# Patient Record
Sex: Female | Born: 1965
Health system: Southern US, Community
[De-identification: ages and names within clinical notes are randomized; demographics above are authoritative.]

## PROBLEM LIST (undated history)

## (undated) DIAGNOSIS — F909 Attention-deficit hyperactivity disorder, unspecified type: Secondary | ICD-10-CM

## (undated) DIAGNOSIS — K52831 Collagenous colitis: Secondary | ICD-10-CM

## (undated) DIAGNOSIS — F3281 Premenstrual dysphoric disorder: Secondary | ICD-10-CM

## (undated) DIAGNOSIS — K649 Unspecified hemorrhoids: Secondary | ICD-10-CM

## (undated) DIAGNOSIS — F419 Anxiety disorder, unspecified: Secondary | ICD-10-CM

## (undated) DIAGNOSIS — W109XXA Fall (on) (from) unspecified stairs and steps, initial encounter: Secondary | ICD-10-CM

## (undated) DIAGNOSIS — N946 Dysmenorrhea, unspecified: Secondary | ICD-10-CM

## (undated) HISTORY — DX: Anxiety disorder, unspecified: F41.9

## (undated) HISTORY — DX: Attention-deficit hyperactivity disorder, unspecified type: F90.9

## (undated) HISTORY — DX: Unspecified hemorrhoids: K64.9

## (undated) HISTORY — DX: Premenstrual dysphoric disorder: F32.81

## (undated) HISTORY — DX: Dysmenorrhea, unspecified: N94.6

## (undated) HISTORY — DX: Collagenous colitis: K52.831

## (undated) HISTORY — DX: Fall (on) (from) unspecified stairs and steps, initial encounter: W10.9XXA

---

## 2002-04-23 ENCOUNTER — Other Ambulatory Visit: Admission: RE | Admit: 2002-04-23 | Discharge: 2002-04-23 | Payer: Self-pay | Admitting: Obstetrics and Gynecology

## 2003-05-26 ENCOUNTER — Other Ambulatory Visit: Admission: RE | Admit: 2003-05-26 | Discharge: 2003-05-26 | Payer: Self-pay | Admitting: Obstetrics and Gynecology

## 2004-04-30 ENCOUNTER — Emergency Department (HOSPITAL_COMMUNITY): Admission: EM | Admit: 2004-04-30 | Discharge: 2004-04-30 | Payer: Self-pay | Admitting: Family Medicine

## 2004-05-21 ENCOUNTER — Other Ambulatory Visit: Admission: RE | Admit: 2004-05-21 | Discharge: 2004-05-21 | Payer: Self-pay | Admitting: Obstetrics and Gynecology

## 2005-03-20 ENCOUNTER — Inpatient Hospital Stay (HOSPITAL_COMMUNITY): Admission: AD | Admit: 2005-03-20 | Discharge: 2005-03-22 | Payer: Self-pay | Admitting: *Deleted

## 2005-03-25 ENCOUNTER — Ambulatory Visit: Admission: RE | Admit: 2005-03-25 | Discharge: 2005-03-25 | Payer: Self-pay | Admitting: Obstetrics and Gynecology

## 2005-04-28 ENCOUNTER — Other Ambulatory Visit: Admission: RE | Admit: 2005-04-28 | Discharge: 2005-04-28 | Payer: Self-pay | Admitting: Obstetrics and Gynecology

## 2005-11-08 ENCOUNTER — Ambulatory Visit: Payer: Self-pay | Admitting: Family Medicine

## 2005-11-15 ENCOUNTER — Ambulatory Visit: Payer: Self-pay | Admitting: Family Medicine

## 2005-11-29 ENCOUNTER — Ambulatory Visit: Payer: Self-pay | Admitting: Family Medicine

## 2005-12-26 ENCOUNTER — Ambulatory Visit: Payer: Self-pay | Admitting: Family Medicine

## 2007-08-01 ENCOUNTER — Ambulatory Visit: Payer: Self-pay | Admitting: Internal Medicine

## 2007-08-01 LAB — CONVERTED CEMR LAB
AST: 23 units/L (ref 0–37)
Albumin: 4.3 g/dL (ref 3.5–5.2)
Basophils Absolute: 0 10*3/uL (ref 0.0–0.1)
Bilirubin, Direct: 0.1 mg/dL (ref 0.0–0.3)
Chloride: 105 meq/L (ref 96–112)
Eosinophils Absolute: 0.1 10*3/uL (ref 0.0–0.6)
Eosinophils Relative: 2.1 % (ref 0.0–5.0)
GFR calc Af Amer: 118 mL/min
GFR calc non Af Amer: 98 mL/min
Glucose, Bld: 94 mg/dL (ref 70–99)
HCT: 36.1 % (ref 36.0–46.0)
Lymphocytes Relative: 33.3 % (ref 12.0–46.0)
MCHC: 35.1 g/dL (ref 30.0–36.0)
MCV: 96.6 fL (ref 78.0–100.0)
Neutro Abs: 2.6 10*3/uL (ref 1.4–7.7)
Neutrophils Relative %: 55.9 % (ref 43.0–77.0)
Potassium: 4.3 meq/L (ref 3.5–5.1)
RBC: 3.73 M/uL — ABNORMAL LOW (ref 3.87–5.11)
Sed Rate: 16 mm/hr (ref 0–25)
Sodium: 140 meq/L (ref 135–145)
TSH: 1.6 microintl units/mL (ref 0.35–5.50)

## 2007-08-02 ENCOUNTER — Encounter: Payer: Self-pay | Admitting: Internal Medicine

## 2007-09-14 ENCOUNTER — Ambulatory Visit: Payer: Self-pay | Admitting: Internal Medicine

## 2007-10-11 DIAGNOSIS — K52831 Collagenous colitis: Secondary | ICD-10-CM

## 2007-10-11 HISTORY — DX: Collagenous colitis: K52.831

## 2007-10-23 ENCOUNTER — Encounter: Payer: Self-pay | Admitting: Internal Medicine

## 2007-10-23 ENCOUNTER — Encounter: Payer: Self-pay | Admitting: Family Medicine

## 2007-10-23 ENCOUNTER — Ambulatory Visit: Payer: Self-pay | Admitting: Internal Medicine

## 2007-10-23 HISTORY — PX: COLONOSCOPY: SHX174

## 2008-01-02 ENCOUNTER — Ambulatory Visit: Payer: Self-pay | Admitting: Internal Medicine

## 2008-01-02 DIAGNOSIS — K52831 Collagenous colitis: Secondary | ICD-10-CM | POA: Insufficient documentation

## 2008-01-02 LAB — CONVERTED CEMR LAB
IgA: 243 mg/dL (ref 68–378)
Tissue Transglutaminase Ab, IgA: 0.3 units (ref <7)

## 2008-02-18 ENCOUNTER — Telehealth: Payer: Self-pay | Admitting: Internal Medicine

## 2008-06-13 ENCOUNTER — Telehealth: Payer: Self-pay | Admitting: Internal Medicine

## 2008-07-02 ENCOUNTER — Ambulatory Visit: Payer: Self-pay | Admitting: Internal Medicine

## 2011-02-22 NOTE — Assessment & Plan Note (Signed)
[facility]                         GASTROENTEROLOGY OFFICE NOTE   NAME:RILEYHiya, Point                       MRN:          [id number]  DATE:01/02/2008                            DOB:          1966-01-30    CHIEF COMPLAINT:  Followup of collagenous colitis.   Collagenous colitis was diagnosed at her colonoscopy.  That was on  January 13.  Small hemorrhoids, as well.  It was recommended that she  start Entocort EC.  She was concerned about possible steroid side  effects, given that her mother had lupus and was on prednisone, and  really waited until a week or so ago to start it.  Once she started it,  her diarrhea is under control.  She feels much better, less abdominal  pain and cramping, as well.  She also recently started Strattera for  ADHD.  She is not requiring Imodium anymore.  She is using Zyrtec  p.r.n., Zoloft 50 mg daily.  She takes Excedrin two a day for aches and  pains and thinks she is probably addicted to the caffeine in that.  She  does not have headaches or any problems like that.   PAST MEDICAL HISTORY:  Reviewed and unchanged.   Weight 145 pounds, pulse 66, blood pressure 104/72.   ASSESSMENT:  Collagenous colitis.  Autoimmune pathogen etiology, versus  medication reaction, explained to the patient today.  She is much better  on the Entocort.  The plan is to take the Entocort EC for six weeks.  She can stop it at that time, or if there are any withdrawal type  symptoms, she can taper it by 1 mg daily for two weeks.  The taper is  optional.  After that time, she will call me back when and if she  develops recurrence.  I explained that the course of this disease is  variable and that she could require repeat Entocort therapy and perhaps  aminosalicylate therapy, such as Asacol or other treatments.  Because of  a possible relationship to celiac disease, a sprue panel and IgA level  will be checked today.  If that were to be positive  in a significant  fashion (some of these antibodies are not specific), an upper GI  endoscopy with small-bowel biopsy could be indicated.  I explained that,  as well.  Collagenous colitis hand-out from the [employer]  [employer] given to the patient.   I will wait to hear from her regarding recurrent signs or symptoms  otherwise.  She will try to taper the Excedrin and talk to her primary  care doctor about that.     Iva Boop, MD,FACG  Electronically Signed    CEG/MedQ  DD: 01/02/2008  DT: 01/02/2008  Job #: [id number]   cc:   Molly Maduro A. Nicholos Johns, M.D.  Tera Mater. Clent Ridges, MD  Randye Lobo, M.D.

## 2011-02-22 NOTE — Assessment & Plan Note (Signed)
[facility]                         GASTROENTEROLOGY OFFICE NOTE   NAME:Latoya Olson, Latoya Olson                          MRN:          [id number]  DATE:08/01/2007                            DOB:          [birth year]    CHIEF COMPLAINT:  Diarrhea, abdominal pain.   HISTORY:  A 45 year old white woman who has had eight weeks of diarrhea.  It started acutely without any prodrome.  She has crampy periumbilical  pain before and during defecation.  The stools are urgent and can awaken  her at night.  After multiple stools, she has seen a drop or streak of  fresh bright red blood on the tissue paper but no other bleeding.  There  has not been  a lot of mucus.  There has been no weight loss, fever,  chills, or other constitutional symptoms.  I detect no history of prior  gastrointestinal illness, antibiotic use, travel, milk intolerance, or  sorbitol use.  She will take Imodium, and one of those will stop her up  for several days.  She had one recently.  She has not had problems like  this before except at about four years ago when her mother was dying,  she had some issues like this.  In between, she has been well.  She  identifies no significant change in her stressors at this time.   PAST MEDICAL HISTORY:  Pre-menstrual disorder, treated successfully with  Zoloft, otherwise unremarkable.   MEDICATIONS:  1. Zoloft 50 mg daily.  2. Imodium A-D p.r.n.  3. Aleve p.r.n.  4. Ecotrin p.r.n.   DRUG ALLERGIES:  None known.   FAMILY HISTORY:  Notable for heart disease in her mother, which led to  her mother's death.  Her mother did have an anal cancer, but no colon  cancer in the family.  No inflammatory bowel disease.   SOCIAL HISTORY:  She is married.  She has two daughters, ages 76 and 2.  She uses some alcohol, no tobacco or drugs.   REVIEW OF SYSTEMS:  All other review of systems are negative.   PHYSICAL EXAMINATION:  A well-developed middle-aged white woman in  no  acute distress.  Height 5 feet 5.  Weight 145 pounds.  Blood pressure 88/62, pulse 68.  HEENT:  Eyes are anicteric.  Normal mouth, lips, and pharynx.  NECK:  Supple.  No thyromegaly or mass.  CHEST:  Clear.  HEART:  S1 and S2.  No murmurs, rubs or gallops.  ABDOMEN:  Soft and nontender without organomegaly or mass today.  Bowel  sounds are present.  It may be slightly distended or bloated.  RECTAL:  In the presence of female nursing staff, shows some small  hemorrhoids in the canal. She has formed stool in the rectal vault with  no mass.  The stool is brown and hemoccult negative.  LOWER EXTREMITIES:  Free of edema.  No neck, supraclavicular, or groin adenopathy.  SKIN:  No rash detected.  PSYCH:  She is alert and oriented x3.   ASSESSMENT:  1. Irritable bowel syndrome seems most like as the cause of  this      diarrhea.  2. Rectal bleeding sounds like it is from hemorrhoids.  3. Other considerations are colorectal neoplasia, which I think is      extremely unlikely, based upon what we are hearing, infection, or      inflammatory conditions such as inflammatory bowel disease,      certainly are possible.   PLAN:  1. CBC with diff, CMET, sed rate, TSH.  I know she has formed stools      today, but when the diarrhea returns off of Imodium, going to go      ahead and collect stool for WBCs, ova and parasites screen, and a      C. diff toxin.  The presence of formed stool today argues very much      for irritable bowel syndrome, I think.  2. Continue Imodium as needed.  Add Align probiotic.  3. Handouts on IBS and diarrhea are given to the patient.  4. We will call with the results and plans.  5. If she has persistent symptoms and problems and/or laboratory      investigation indicates a colonoscopy, quite possibly could be a      next step in the evaluation.  I will plan for some sort of office      followup at some point, but we are awaiting to schedule that      pending the  above.     Iva Boop, MD,FACG  Electronically Signed    CEG/MedQ  DD: 08/01/2007  DT: 08/02/2007  Job #: [id number]   cc:   Randye Lobo, M.D.  Tera Mater. Clent Ridges, MD

## 2011-09-19 ENCOUNTER — Other Ambulatory Visit: Payer: Self-pay | Admitting: Obstetrics and Gynecology

## 2012-05-04 ENCOUNTER — Ambulatory Visit (INDEPENDENT_AMBULATORY_CARE_PROVIDER_SITE_OTHER): Payer: BC Managed Care – PPO | Admitting: Family Medicine

## 2012-05-04 ENCOUNTER — Encounter: Payer: Self-pay | Admitting: Family Medicine

## 2012-05-04 VITALS — BP 110/70 | HR 69 | Temp 99.0°F | Wt 164.0 lb

## 2012-05-04 DIAGNOSIS — F32A Depression, unspecified: Secondary | ICD-10-CM

## 2012-05-04 DIAGNOSIS — M549 Dorsalgia, unspecified: Secondary | ICD-10-CM

## 2012-05-04 DIAGNOSIS — F329 Major depressive disorder, single episode, unspecified: Secondary | ICD-10-CM

## 2012-05-04 MED ORDER — ESCITALOPRAM OXALATE 20 MG PO TABS: 20.0000 mg | ORAL_TABLET | Freq: Every day | ORAL | Status: DC

## 2012-05-04 NOTE — Progress Notes (Signed)
  Subjective:    Patient ID: Latoya Olson, female    DOB: [birth year], 46 y.o.   MRN: [id number]  HPI Here for several issues. She has not been seen her for years. She had been seeing Dr. Edward Jolly, and her next GYN exam is not due until October. For the past 2 months she has had an intermittent sharp pain in the right middle back that sometimes radiates around the right flank to the RLQ. This is worsened by movements, such twisting her trunk or reaching over her head. No trouble with BMs or urinations. Her menses are regular, but they have been a little heavier with a little more cramping lately. No nausea or fever. Taking Advil which helps. Also she has been on Zoloft for 7 years for premenstrual mood swings, and lately they are getting worse. She has a lot of anxiety and depression at times. Her husband has a job in [place] and lives there full time, but they have not been able to sell their house here. Therefore she feels like a single mother here, working and raising 2 children.    Review of Systems  Constitutional: Negative.   Respiratory: Negative.   Cardiovascular: Negative.   Gastrointestinal: Negative.   Genitourinary: Positive for flank pain. Negative for dysuria, urgency, frequency, hematuria, difficulty urinating and pelvic pain.  Musculoskeletal: Positive for back pain.  Psychiatric/Behavioral: Positive for dysphoric mood and decreased concentration. The patient is nervous/anxious.        Objective:   Physical Exam  Constitutional: She appears well-developed and well-nourished.  Neck: No thyromegaly present.  Cardiovascular: Normal rate, regular rhythm, normal heart sounds and intact distal pulses.   Pulmonary/Chest: Effort normal and breath sounds normal.  Abdominal: Soft. Bowel sounds are normal. She exhibits no distension and no mass. There is no tenderness. There is no rebound and no guarding.  Musculoskeletal:       Tender over the right lower back, not tender in the flank at  all. Full ROM of the spine, but extension is painful   Lymphadenopathy:    She has no cervical adenopathy.  Skin: Skin is warm and dry. No rash noted. No erythema.  Psychiatric: She has a normal mood and affect. Her behavior is normal. Thought content normal.          Assessment & Plan:  Switch from Zoloft to Lexapro 20 mg a day. Set up Xrays of the thoracic and lumbar spines. Set up labs and a cpx next week. This pain seems to be a musculoskeletal pain, possibly a pinched nerve. Use Advil prn

## 2012-05-07 ENCOUNTER — Other Ambulatory Visit (INDEPENDENT_AMBULATORY_CARE_PROVIDER_SITE_OTHER): Payer: BC Managed Care – PPO

## 2012-05-07 ENCOUNTER — Ambulatory Visit (INDEPENDENT_AMBULATORY_CARE_PROVIDER_SITE_OTHER)
Admission: RE | Admit: 2012-05-07 | Discharge: 2012-05-07 | Disposition: A | Discharge status: Home / Self Care | Payer: BC Managed Care – PPO | Source: Ambulatory Visit | Attending: Family Medicine | Admitting: Family Medicine

## 2012-05-07 DIAGNOSIS — Z0000 Encounter for general adult medical examination without abnormal findings: Secondary | ICD-10-CM

## 2012-05-07 DIAGNOSIS — M549 Dorsalgia, unspecified: Secondary | ICD-10-CM

## 2012-05-07 LAB — CBC WITH DIFFERENTIAL/PLATELET
Basophils Absolute: 0 10*3/uL (ref 0.0–0.1)
Eosinophils Relative: 3.1 % (ref 0.0–5.0)
HCT: 37 % (ref 36.0–46.0)
Lymphocytes Relative: 26.7 % (ref 12.0–46.0)
Monocytes Relative: 8 % (ref 3.0–12.0)
Neutrophils Relative %: 61.6 % (ref 43.0–77.0)
Platelets: 244 10*3/uL (ref 150.0–400.0)
RDW: 11.8 % (ref 11.5–14.6)
WBC: 5.3 10*3/uL (ref 4.5–10.5)

## 2012-05-07 LAB — HEPATIC FUNCTION PANEL
ALT: 17 U/L (ref 0–35)
AST: 21 U/L (ref 0–37)
Alkaline Phosphatase: 40 U/L (ref 39–117)
Bilirubin, Direct: 0 mg/dL (ref 0.0–0.3)
Total Bilirubin: 0.4 mg/dL (ref 0.3–1.2)

## 2012-05-07 LAB — POCT URINALYSIS DIPSTICK
Glucose, UA: NEGATIVE
Nitrite, UA: NEGATIVE
Protein, UA: NEGATIVE
Spec Grav, UA: 1.025
Urobilinogen, UA: 0.2

## 2012-05-07 LAB — BASIC METABOLIC PANEL
BUN: 21 mg/dL (ref 6–23)
Calcium: 9 mg/dL (ref 8.4–10.5)
GFR: 105.53 mL/min (ref >60.00)
Glucose, Bld: 85 mg/dL (ref 70–99)
Potassium: 4.4 mEq/L (ref 3.5–5.1)

## 2012-05-07 LAB — LIPID PANEL
HDL: 66.6 mg/dL (ref >39.00)
Total CHOL/HDL Ratio: 3
VLDL: 14.2 mg/dL (ref 0.0–40.0)

## 2012-05-07 LAB — TSH: TSH: 1.92 u[IU]/mL (ref 0.35–5.50)

## 2012-05-07 NOTE — Progress Notes (Signed)
Quick Note:  I spoke with pt ______ 

## 2012-05-11 ENCOUNTER — Ambulatory Visit (INDEPENDENT_AMBULATORY_CARE_PROVIDER_SITE_OTHER): Payer: BC Managed Care – PPO | Admitting: Family Medicine

## 2012-05-11 ENCOUNTER — Encounter: Payer: Self-pay | Admitting: Family Medicine

## 2012-05-11 VITALS — BP 110/78 | HR 68 | Temp 98.7°F | Ht 64.5 in | Wt 166.0 lb

## 2012-05-11 DIAGNOSIS — Z0000 Encounter for general adult medical examination without abnormal findings: Secondary | ICD-10-CM

## 2012-05-11 NOTE — Progress Notes (Signed)
  Subjective:    Patient ID: Latoya Olson, female    DOB: September 17, 1966, 46 y.o.   MRN: [id number]  HPI 46 yr old female for a cpx. She is doing well in general. We recently saw her for anxiety and depression, and she started on Lexapro. So far she likes this and feels a little better.    Review of Systems  Constitutional: Negative.   HENT: Negative.   Eyes: Negative.   Respiratory: Negative.   Cardiovascular: Negative.   Gastrointestinal: Negative.   Genitourinary: Negative for dysuria, urgency, frequency, hematuria, flank pain, decreased urine volume, enuresis, difficulty urinating, pelvic pain and dyspareunia.  Musculoskeletal: Negative.   Skin: Negative.   Neurological: Negative.   Hematological: Negative.   Psychiatric/Behavioral: Negative.        Objective:   Physical Exam  Constitutional: She is oriented to person, place, and time. She appears well-developed and well-nourished. No distress.  HENT:  Head: Normocephalic and atraumatic.  Right Ear: External ear normal.  Left Ear: External ear normal.  Nose: Nose normal.  Mouth/Throat: Oropharynx is clear and moist. No oropharyngeal exudate.  Eyes: Conjunctivae and EOM are normal. Pupils are equal, round, and reactive to light. No scleral icterus.  Neck: Normal range of motion. Neck supple. No JVD present. No thyromegaly present.  Cardiovascular: Normal rate, regular rhythm, normal heart sounds and intact distal pulses.  Exam reveals no gallop and no friction rub.   No murmur heard. Pulmonary/Chest: Effort normal and breath sounds normal. No respiratory distress. She has no wheezes. She has no rales. She exhibits no tenderness.  Abdominal: Soft. Bowel sounds are normal. She exhibits no distension and no mass. There is no tenderness. There is no rebound and no guarding.  Musculoskeletal: Normal range of motion. She exhibits no edema and no tenderness.  Lymphadenopathy:    She has no cervical adenopathy.  Neurological: She  is alert and oriented to person, place, and time. She has normal reflexes. No cranial nerve deficit. She exhibits normal muscle tone. Coordination normal.  Skin: Skin is warm and dry. No rash noted. No erythema.  Psychiatric: She has a normal mood and affect. Her behavior is normal. Judgment and thought content normal.          Assessment & Plan:  Well exam. Suggested she get regular exercise and take calcium and vitamin D daily.

## 2012-05-11 NOTE — Progress Notes (Signed)
Quick Note:  Pt came in for CPE today and the doctor went over results. ______

## 2012-12-19 ENCOUNTER — Other Ambulatory Visit: Payer: Self-pay | Admitting: Dermatology

## 2013-01-30 ENCOUNTER — Other Ambulatory Visit: Payer: Self-pay | Admitting: Family Medicine

## 2013-02-06 ENCOUNTER — Encounter: Payer: Self-pay | Admitting: Family Medicine

## 2013-02-06 ENCOUNTER — Ambulatory Visit (INDEPENDENT_AMBULATORY_CARE_PROVIDER_SITE_OTHER): Payer: BC Managed Care – PPO | Admitting: Family Medicine

## 2013-02-06 VITALS — BP 118/76 | HR 64 | Temp 98.2°F | Wt 169.0 lb

## 2013-02-06 DIAGNOSIS — F411 Generalized anxiety disorder: Secondary | ICD-10-CM | POA: Insufficient documentation

## 2013-02-06 DIAGNOSIS — F909 Attention-deficit hyperactivity disorder, unspecified type: Secondary | ICD-10-CM | POA: Insufficient documentation

## 2013-02-06 MED ORDER — AMPHETAMINE-DEXTROAMPHET ER 10 MG PO CP24: 10.0000 mg | ORAL_CAPSULE | ORAL | Status: DC

## 2013-02-06 NOTE — Progress Notes (Signed)
  Subjective:    Patient ID: Latoya Olson, female    DOB: Sep 14, 1966, 47 y.o.   MRN: [id number]  HPI Here to ask about getting on meds for ADHD. She was diagnosed with this 3 years ago and she tried Theatre manager for a time. They increased the dose several times and got up to 100 mg a day, but it never seemed to help her much. She describes herself as feeling scattered and having a tough time focusing on things. 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 Her daughter, age 9, also has ADHD. She feels well otherwise. Her anxiety is well managed with lexapro.    Review of Systems  Constitutional: Negative.   Neurological: Negative.   Psychiatric/Behavioral: Positive for decreased concentration. Negative for behavioral problems, confusion, dysphoric mood and agitation. The patient is not nervous/anxious.        Objective:   Physical Exam  Constitutional: She is oriented to person, place, and time. She appears well-developed and well-nourished.  Neurological: She is alert and oriented to person, place, and time.  Psychiatric: She has a normal mood and affect. Her behavior is normal. Thought content normal.          Assessment & Plan:  Try Adderall XR 10 mg a day. Recheck in 2-3 weeks

## 2013-02-25 ENCOUNTER — Telehealth: Payer: Self-pay | Admitting: Family Medicine

## 2013-02-25 MED ORDER — AMPHETAMINE-DEXTROAMPHET ER 10 MG PO CP24: 10.0000 mg | ORAL_CAPSULE | ORAL | Status: DC

## 2013-02-25 NOTE — Telephone Encounter (Signed)
done

## 2013-02-25 NOTE — Telephone Encounter (Signed)
Pt needs new rx generic adderall xr 10 mg °

## 2013-02-26 NOTE — Telephone Encounter (Signed)
Script is ready and I spoke with pt. 

## 2013-02-27 ENCOUNTER — Other Ambulatory Visit: Payer: Self-pay | Admitting: Family Medicine

## 2013-03-18 ENCOUNTER — Telehealth: Payer: Self-pay | Admitting: Family Medicine

## 2013-03-18 MED ORDER — AMPHETAMINE-DEXTROAMPHET ER 10 MG PO CP24: 10.0000 mg | ORAL_CAPSULE | Freq: Two times a day (BID) | ORAL | Status: DC

## 2013-03-18 NOTE — Telephone Encounter (Signed)
done

## 2013-03-18 NOTE — Telephone Encounter (Signed)
Pt  need new rx generic adderall xr 10 mg twice a day instead on once a day. Pt stated MD is aware she could take med twice a day.

## 2013-03-18 NOTE — Telephone Encounter (Signed)
Script is ready for pick up, tried to reach pt by phone, no answer.

## 2013-03-22 IMAGING — CR DG THORACIC SPINE 2V
3 series · 3 of 3 positions shown · non-contrast
Comparison: None

CLINICAL DATA: Back pain

THORACIC SPINE - 2 VIEW

[view not recorded (1 of 3)]
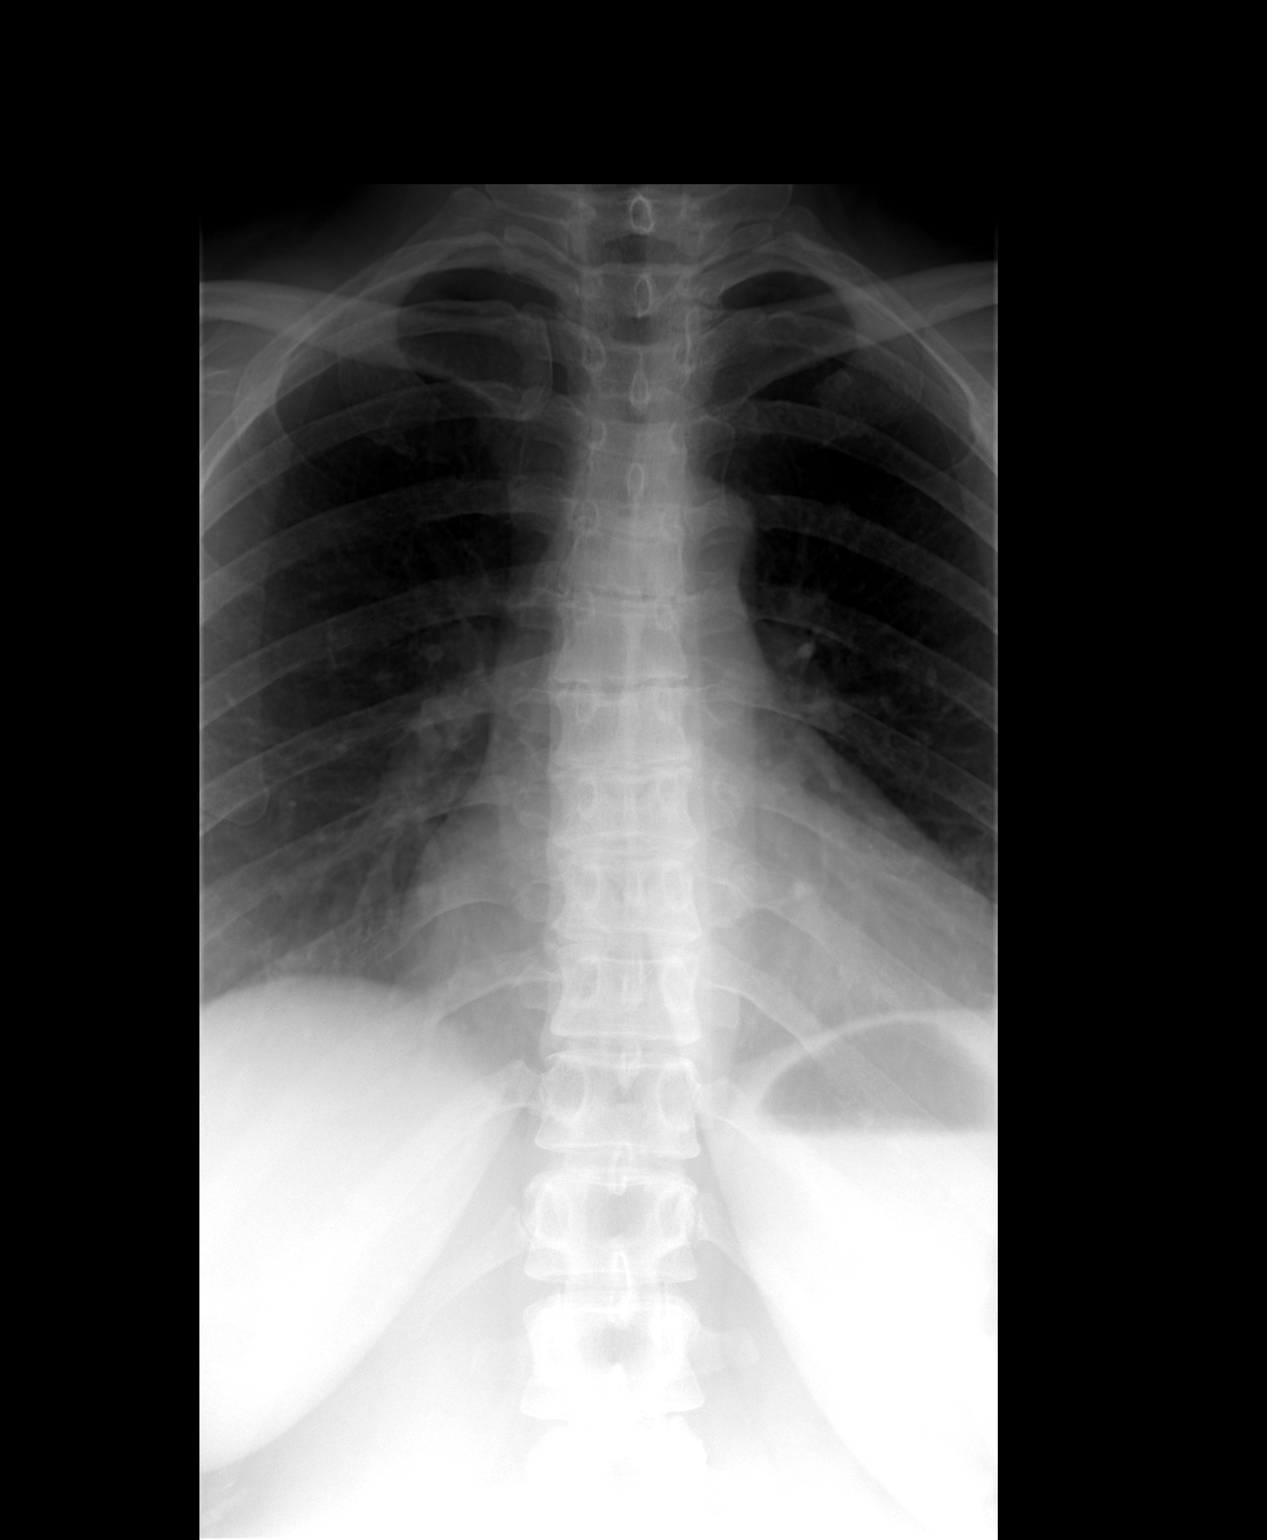

[view not recorded (2 of 3)]
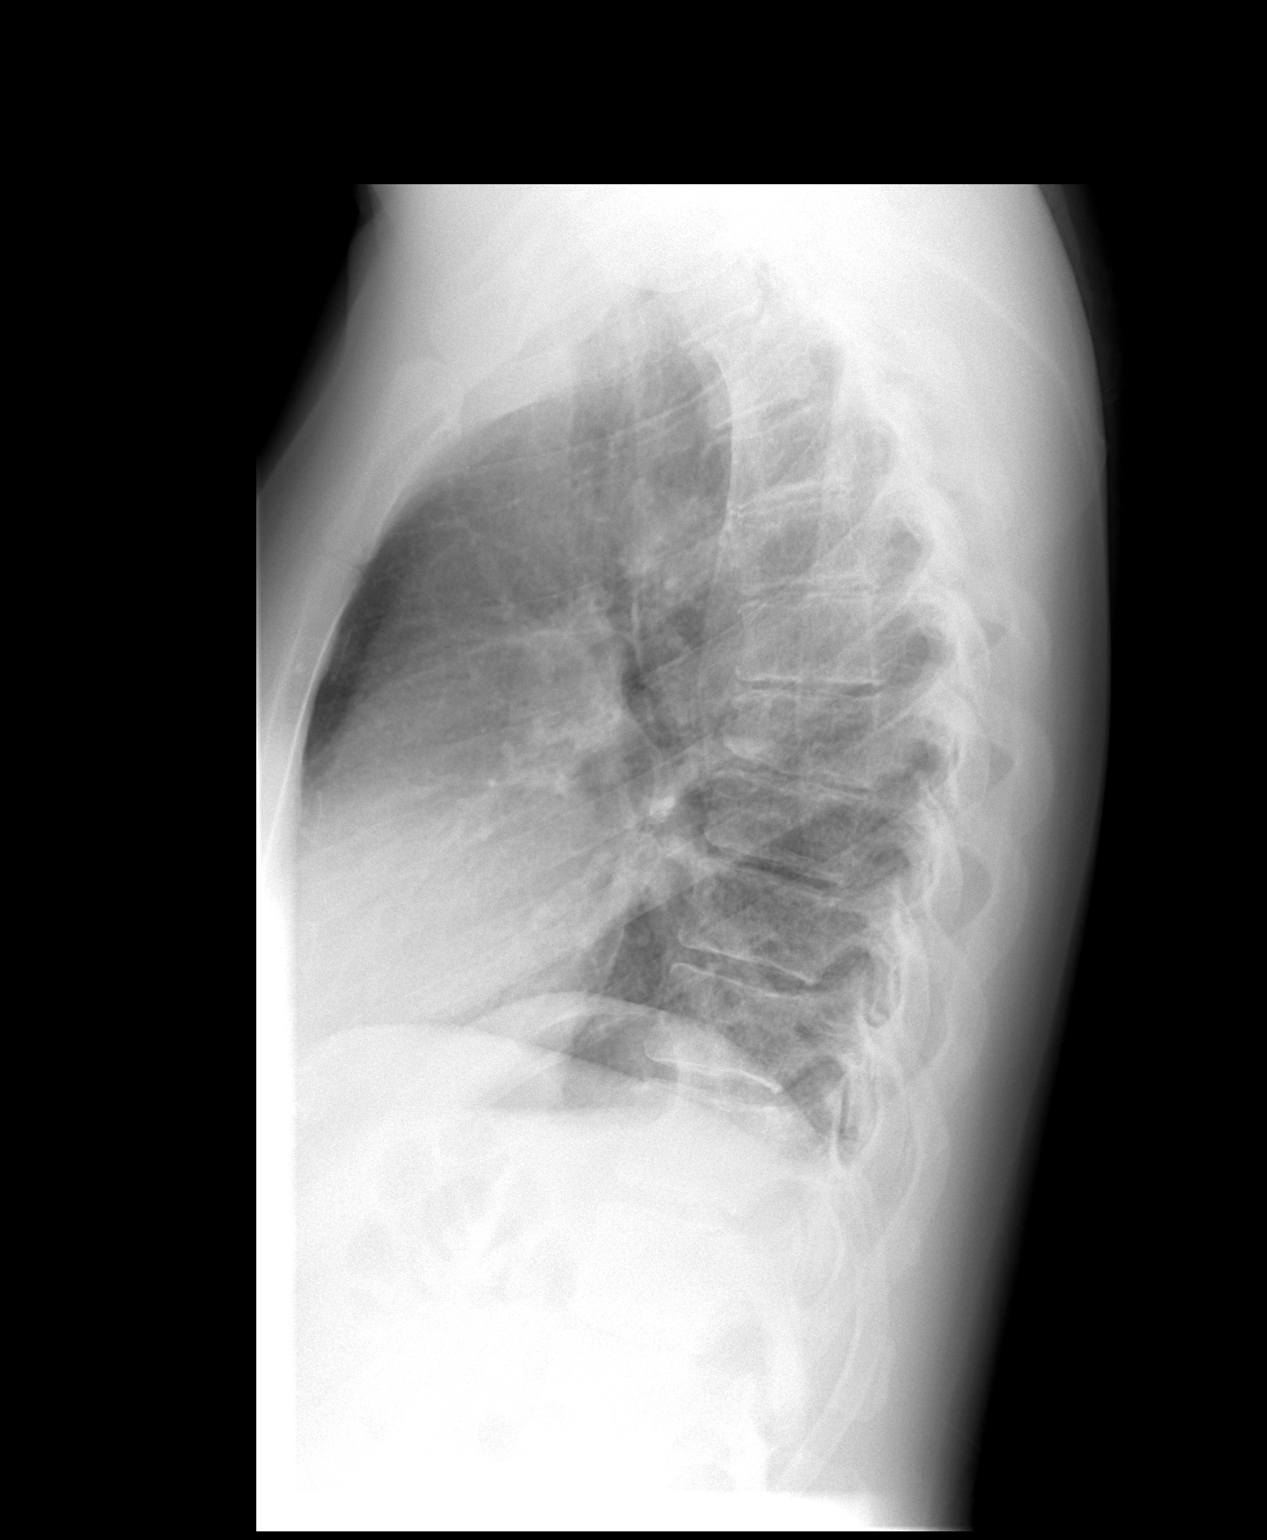

[view not recorded (3 of 3)]
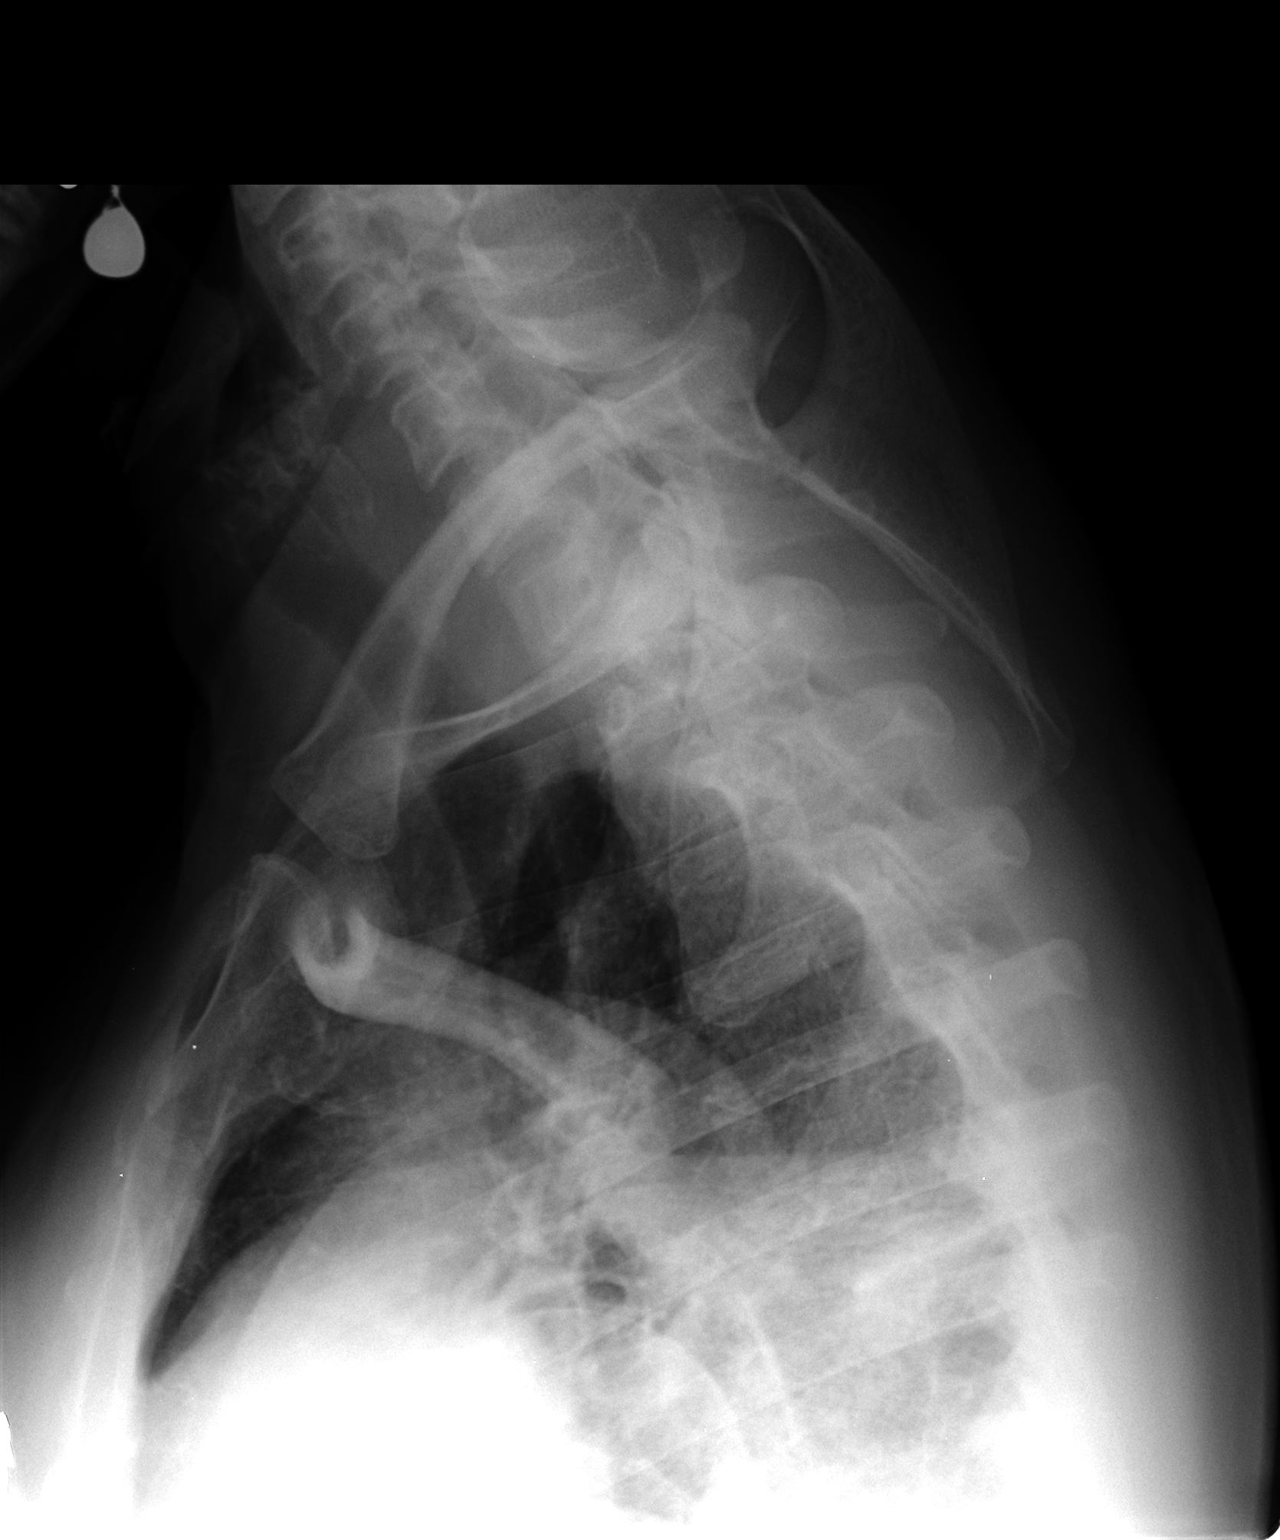

[3 of 3 positions shown; findings below may reference images not displayed]

FINDINGS: There is a very mild curvature of the thoracic spine
which is convex to the right.

Mild anterior wedging of the mid thoracic spine vertebra appears
chronic.

There is mild multilevel disc space narrowing and ventral spurring
identified compatible with degenerative disc disease. No fractures
or subluxations.
IMPRESSION: 1.  No acute findings.
2.  Mild degenerative change.

## 2013-03-22 IMAGING — CR DG LUMBAR SPINE COMPLETE 4+V
5 series · 5 of 5 positions shown · non-contrast
Comparison: None

CLINICAL DATA: Back pain

LUMBAR SPINE - COMPLETE 4+ VIEW

[view not recorded (1 of 5)]
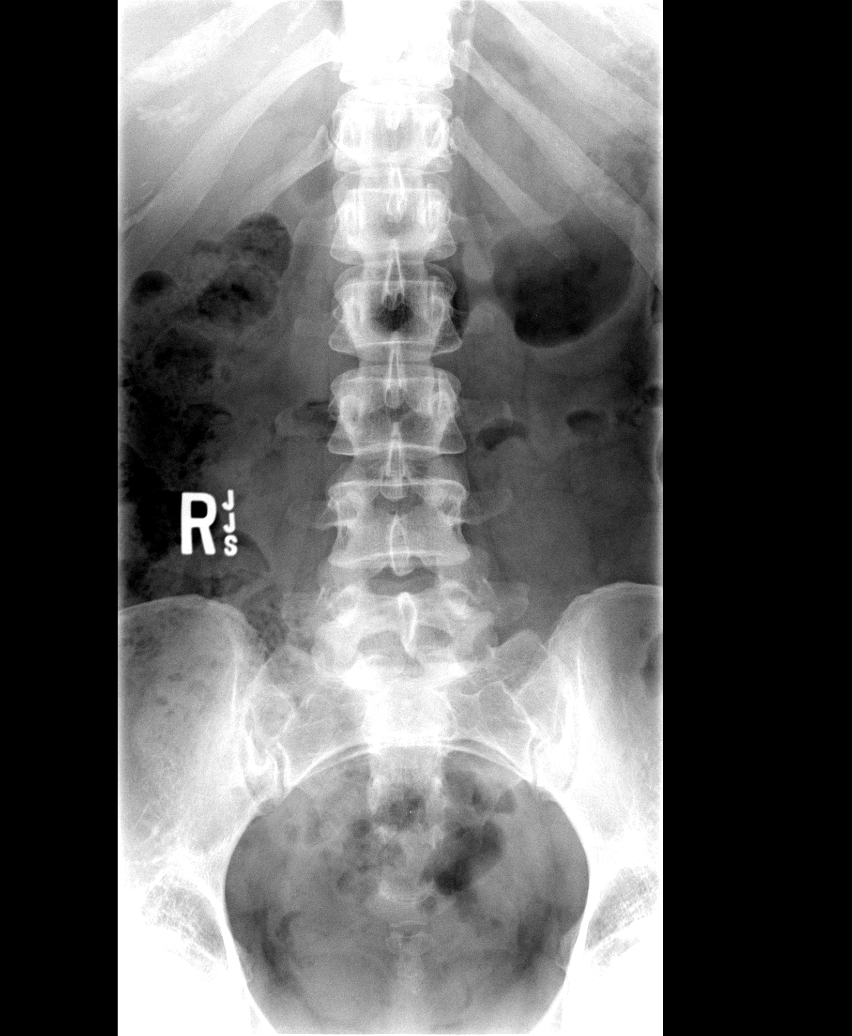

[view not recorded (2 of 5)]
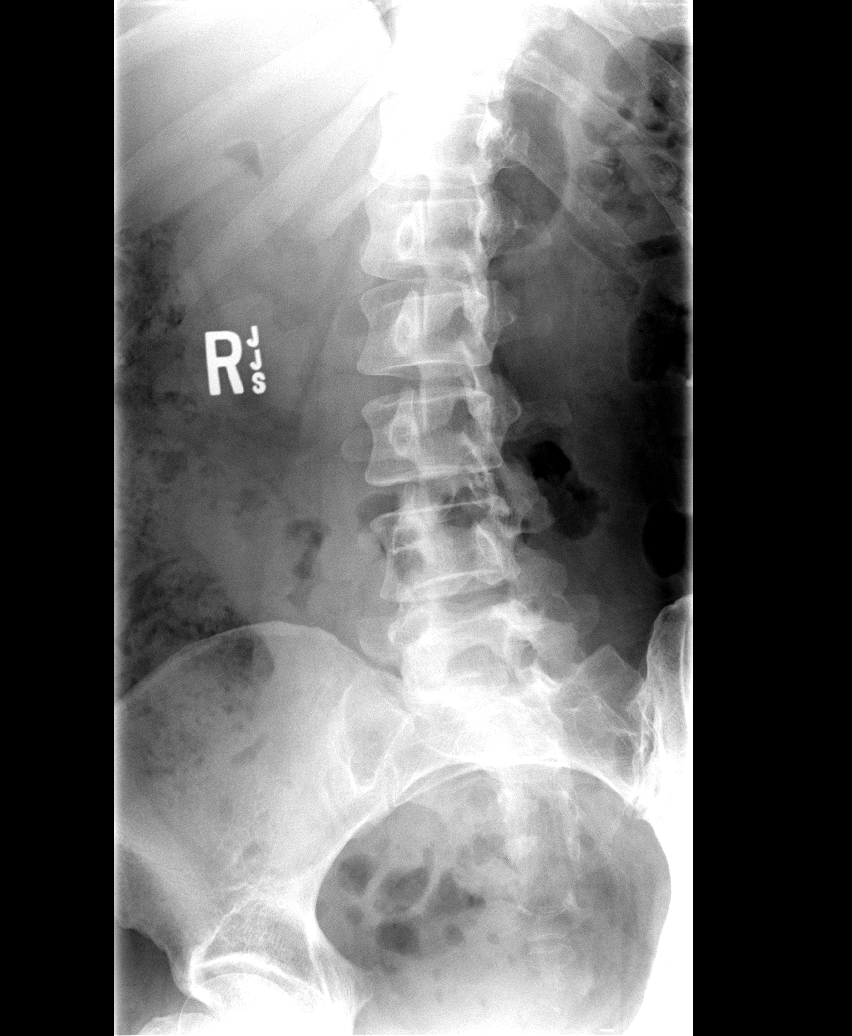

[view not recorded (3 of 5)]
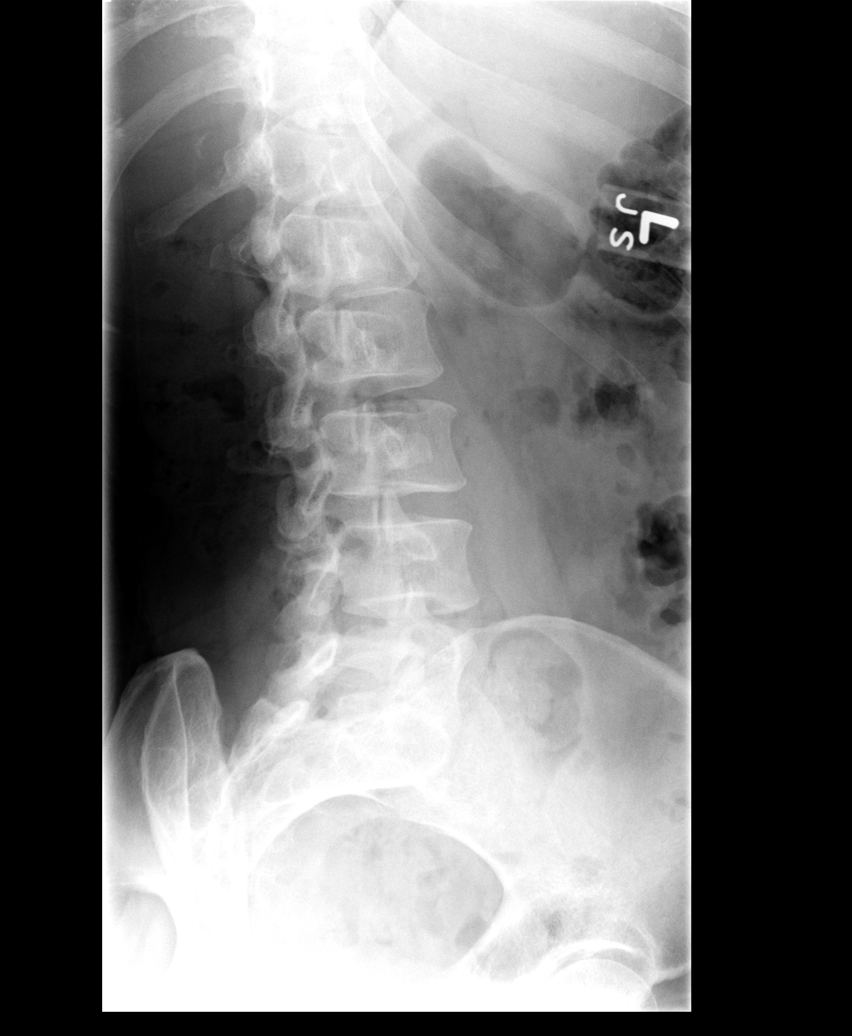

[view not recorded (4 of 5)]
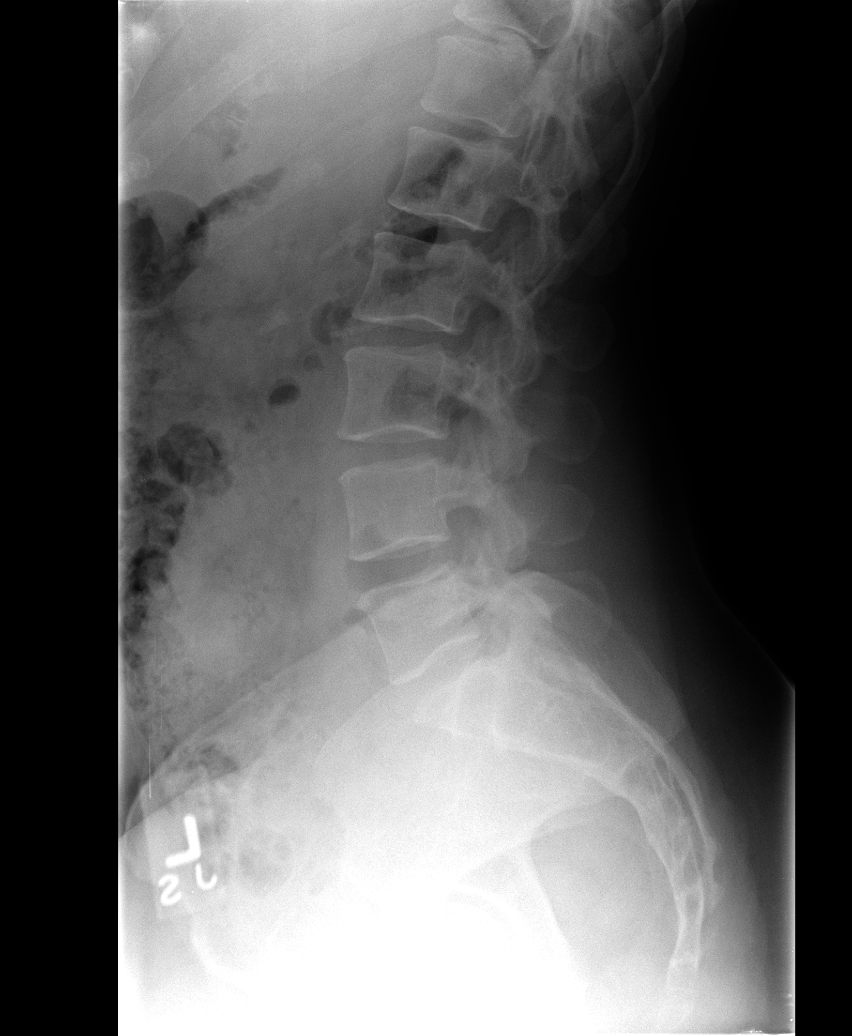

[view not recorded (5 of 5)]
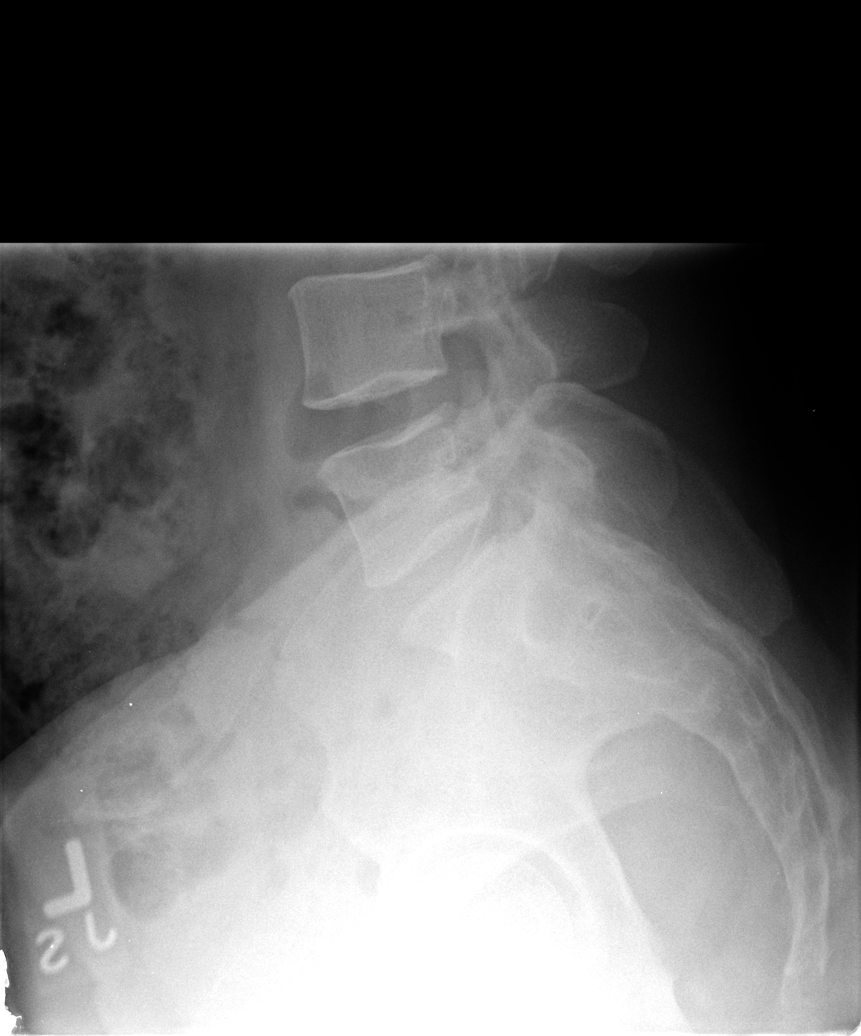

[5 of 5 positions shown; findings below may reference images not displayed]

FINDINGS: There is no evidence of lumbar spine fracture.  Alignment
is normal.  Intervertebral disc spaces are maintained.
IMPRESSION: No acute findings.

## 2013-04-21 ENCOUNTER — Other Ambulatory Visit: Payer: Self-pay | Admitting: Family Medicine

## 2013-04-22 NOTE — Telephone Encounter (Signed)
Ok to refill 30   PCP NA   Needs FU visit with Dr Clent Ridges .  Or further refills per Dr Clent Ridges

## 2013-05-10 ENCOUNTER — Encounter: Payer: Self-pay | Admitting: Internal Medicine

## 2013-05-10 ENCOUNTER — Encounter: Payer: Self-pay | Admitting: Family Medicine

## 2013-05-10 ENCOUNTER — Ambulatory Visit (INDEPENDENT_AMBULATORY_CARE_PROVIDER_SITE_OTHER): Payer: BC Managed Care – PPO | Admitting: Family Medicine

## 2013-05-10 VITALS — BP 110/68 | HR 75 | Temp 98.2°F | Wt 165.0 lb

## 2013-05-10 DIAGNOSIS — F411 Generalized anxiety disorder: Secondary | ICD-10-CM

## 2013-05-10 DIAGNOSIS — R1011 Right upper quadrant pain: Secondary | ICD-10-CM

## 2013-05-10 DIAGNOSIS — G8929 Other chronic pain: Secondary | ICD-10-CM

## 2013-05-10 DIAGNOSIS — F909 Attention-deficit hyperactivity disorder, unspecified type: Secondary | ICD-10-CM

## 2013-05-10 MED ORDER — AMPHETAMINE-DEXTROAMPHET ER 10 MG PO CP24: 10.0000 mg | ORAL_CAPSULE | Freq: Two times a day (BID) | ORAL | Status: DC

## 2013-05-10 MED ORDER — ESCITALOPRAM OXALATE 20 MG PO TABS: 20.0000 mg | ORAL_TABLET | Freq: Every day | ORAL | Status: DC

## 2013-05-10 NOTE — Progress Notes (Signed)
  Subjective:    Patient ID: Latoya Olson, female    DOB: [birth year], 47 y.o.   MRN: [id number]  HPI Here to follow up on ADHD and to discuss her abdominal sx. She is very pleased with Adderall and wants to stay on this. For the past year she has had constant burning pains in the RUQ and the right flank. Her bowels "gurgle" a lot, and she has developed diarrhea. Most of her stools are loose and she has urgency. No fevers or nausea. No urinary sx. She had a colonoscopy in 2009 showing mild collagenous colitis.    Review of Systems  Constitutional: Negative.   Respiratory: Negative.   Cardiovascular: Negative.   Gastrointestinal: Positive for abdominal pain and diarrhea. Negative for nausea, vomiting, constipation, blood in stool, abdominal distention and rectal pain.  Genitourinary: Negative.        Objective:   Physical Exam  Constitutional: She appears well-developed and well-nourished.  Abdominal: Soft. Bowel sounds are normal. She exhibits no distension and no mass. There is no tenderness. There is no rebound and no guarding.          Assessment & Plan:  Refilled her Adderall XR. Her bowel complaints are consistent with some type of colitis. We will refer her back to GI.

## 2013-06-18 ENCOUNTER — Encounter: Payer: Self-pay | Admitting: Internal Medicine

## 2013-06-18 ENCOUNTER — Ambulatory Visit (INDEPENDENT_AMBULATORY_CARE_PROVIDER_SITE_OTHER): Payer: BC Managed Care – PPO | Admitting: Internal Medicine

## 2013-06-18 VITALS — BP 100/68 | HR 60 | Ht 64.5 in | Wt 164.0 lb

## 2013-06-18 DIAGNOSIS — M359 Systemic involvement of connective tissue, unspecified: Secondary | ICD-10-CM

## 2013-06-18 MED ORDER — BUDESONIDE 3 MG PO CP24: 9.0000 mg | ORAL_CAPSULE | ORAL | Status: DC

## 2013-06-18 NOTE — Patient Instructions (Addendum)
Please follow up in 6 weeks with Dr Leone Payor We have given you Florastor to take once daily. We have given you a low fiber diet to follow. We have sent the following medications to your pharmacy for you to pick up at your convenience: Entocort We have given you a Collagenous Colitis handout. CC:  Gershon Crane MD

## 2013-06-18 NOTE — Progress Notes (Signed)
  Subjective:  Referred by Shellia Carwin, MD  Patient ID: Latoya Olson, female    DOB: Jul 03, 1966, 47 y.o.   MRN: [id number]  HPI  The patient is here for evaluation and treatment of abdominal pain and diarrhea. She was diagnosed with collagenous colitis in 2009. Used budesonide with success but has not followed up. She related that she has been afraid of steroid Tx because her mom was treated with prednisone for lupus and had many side effects.  She is having frequent watery stools day and night (3AM) associated with lower abdominal cramps. She also has a burning right flank pain.  She has self-treated with Imodium AD but that constipates x 2 days.  She eats a high-fiber diet  She is currently separating from her husband. She is worried that the flank pain could represent cancer. No GU sxs  No Known Allergies Outpatient Prescriptions Prior to Visit  Medication Sig Dispense Refill  . amphetamine-dextroamphetamine (ADDERALL XR) 10 MG 24 hr capsule Take 1 capsule (10 mg total) by mouth 2 (two) times daily.  60 capsule  0  . escitalopram (LEXAPRO) 20 MG tablet Take 1 tablet (20 mg total) by mouth daily.  90 tablet  3   No facility-administered medications prior to visit.   Past Medical History  Diagnosis Date  . Anxiety   . Premenstrual dysphoric disorder   . ADHD (attention deficit hyperactivity disorder)   . Collagenous colitis 2009    sees Dr. Stan Head    Past Surgical History  Procedure Laterality Date  . Colonoscopy  10/23/07    [facility]   History   Social History  . Marital Status: Married    Spouse Name: N/A    Number of Children: N/A  . Years of Education: N/A   Social History Main Topics  . Smoking status: Never Smoker   . Smokeless tobacco: Never Used  . Alcohol Use: 0.0 oz/week     Comment: glass of wine each day  . Drug Use: No   Social History Narrative   Married - separating (2014)   2 daughters   Family History  Problem Relation Age of Onset  .  Coronary artery disease    . Lymphoma Mother   . Cancer      lung  . Colon cancer Mother   . Lupus Mother     Review of Systems As above in HPI, all other ROS negative    Objective:   Physical Exam General:  NAD Eyes:   anicteric Lungs:  clear Heart:  S1S2 no rubs, murmurs or gallops Abdomen:  soft and nontender, BS+ Back:  nontender Ext:   no edema    Data Reviewed:  2009 colonoscopy/pathology and notes     Assessment & Plan:  COLLAGENOUS COLITIS - Plan: budesonide (ENTOCORT EC) 3 MG 24 hr capsule  Cc:FRY,STEPHEN A, MD

## 2013-06-18 NOTE — Assessment & Plan Note (Addendum)
Restart Entocort EC - side effcets reviewed REV 6 weeks Probiotic recommended Florastor Collagenous Colitis handout

## 2013-07-31 ENCOUNTER — Ambulatory Visit: Payer: BC Managed Care – PPO | Admitting: Internal Medicine

## 2013-07-31 ENCOUNTER — Telehealth: Payer: Self-pay | Admitting: Internal Medicine

## 2013-07-31 NOTE — Telephone Encounter (Signed)
No charge per Dr. Gessner. 

## 2013-08-29 ENCOUNTER — Encounter: Payer: Self-pay | Admitting: Internal Medicine

## 2013-08-29 ENCOUNTER — Ambulatory Visit (INDEPENDENT_AMBULATORY_CARE_PROVIDER_SITE_OTHER): Payer: BC Managed Care – PPO | Admitting: Internal Medicine

## 2013-08-29 VITALS — BP 132/64 | HR 68 | Ht 64.5 in | Wt 164.0 lb

## 2013-08-29 DIAGNOSIS — M359 Systemic involvement of connective tissue, unspecified: Secondary | ICD-10-CM

## 2013-08-29 MED ORDER — LOPERAMIDE HCL 2 MG PO TABS: 2.0000 mg | ORAL_TABLET | Freq: Four times a day (QID) | ORAL | Status: DC | PRN

## 2013-08-29 NOTE — Patient Instructions (Signed)
If you start to have persistent diarrhea again call me and we will restart Entocort. Please sign up for My Chart so we can communicate better.  It is the time of year to have a vaccination to prevent the flu (influenza virus). Please have this done through your primary care provider or you can get this done at local pharmacies or the [facility]. It would be very helpful if you notify your primary care provider when and where you had the vaccination given by messaging them in My Chart, leaving a message or faxing the information.  See you in 3 months - please call back in december and make an appointment for February.  I appreciate the opportunity to care for you. Iva Boop, MD, [facility]

## 2013-08-29 NOTE — Assessment & Plan Note (Addendum)
In remission Will dc Entocort and she will RTC 3 months, sooner, prn Loperamide prn

## 2013-08-30 NOTE — Progress Notes (Signed)
  Subjective:    Patient ID: Latoya Olson, female    DOB: 1966-08-16, 47 y.o.   MRN: [id number]  HPI Is here for followup of collagenous colitis. She missed her earlier followup appointment because her father passed away. She was also been under stress with a divorce. Her colitis is under great control after being on 6 mg to 9 mg of Entocort. She has cut the dose at times because she's actually become constipated. Medications, allergies, past medical history, past surgical history, family history and social history are reviewed and updated in the EMR.   Review of Systems As above    Objective:   Physical Exam No acute distress    Assessment & Plan:   1. COLLAGENOUS COLITIS

## 2013-09-02 ENCOUNTER — Telehealth: Payer: Self-pay | Admitting: Family Medicine

## 2013-09-02 NOTE — Telephone Encounter (Signed)
Pt need new rx generic adderall xr  20 mg not 10 mg. Pt stated rx was changed about 3 month ago.

## 2013-09-03 MED ORDER — AMPHETAMINE-DEXTROAMPHET ER 20 MG PO CP24: 20.0000 mg | ORAL_CAPSULE | Freq: Two times a day (BID) | ORAL | Status: DC

## 2013-09-03 NOTE — Telephone Encounter (Signed)
Script is ready for pick up and I left a voice message.  

## 2013-09-03 NOTE — Telephone Encounter (Signed)
done

## 2013-09-06 ENCOUNTER — Telehealth: Payer: Self-pay | Admitting: Family Medicine

## 2013-09-06 NOTE — Telephone Encounter (Signed)
Pt's rx was written incorrectly (amphetamine-dextroamphetamine (ADDERALL XR) 20 MG 24 hr capsule for  2 X day)  and pt needs new rx.  Pt states it is supposed to be 1 /10 mg ,2 x day.  BUT pt would like to change the rx to 1/20 mg ONE time per. Pt will still be on the 20 mg a day  (not 40 mg as new rx states) Pt is out of med.

## 2013-09-06 NOTE — Telephone Encounter (Signed)
Left message on voicemail to call office.  

## 2013-09-09 MED ORDER — AMPHETAMINE-DEXTROAMPHET ER 20 MG PO CP24: 20.0000 mg | ORAL_CAPSULE | Freq: Every day | ORAL | Status: DC

## 2013-09-09 NOTE — Telephone Encounter (Signed)
Pt called. Pt's insurance will not cover her Rx the way it is currently written (20 mg 2 x a day).  Pt states it can be written for 20 mg 1 x daily or the way it was orginially written ( 1/10 mg, 2 x a day).  Please advise.

## 2013-09-09 NOTE — Telephone Encounter (Signed)
Then why not fill the rx we wrote and take only one a day. That way it will last for 2 months

## 2013-09-09 NOTE — Telephone Encounter (Signed)
Script is ready for pick up and I left a voice message with below information. 

## 2013-09-09 NOTE — Telephone Encounter (Signed)
I wrote for this for one month only for her to try it. Tell her to bring the other rx we wrote in with her so we can destroy them

## 2013-09-17 ENCOUNTER — Encounter: Payer: Self-pay | Admitting: *Deleted

## 2013-09-18 ENCOUNTER — Encounter: Payer: Self-pay | Admitting: Family Medicine

## 2013-09-18 ENCOUNTER — Ambulatory Visit (INDEPENDENT_AMBULATORY_CARE_PROVIDER_SITE_OTHER): Payer: BC Managed Care – PPO | Admitting: Family Medicine

## 2013-09-18 VITALS — BP 120/72 | Temp 99.5°F | Wt 164.0 lb

## 2013-09-18 DIAGNOSIS — F411 Generalized anxiety disorder: Secondary | ICD-10-CM

## 2013-09-18 DIAGNOSIS — F909 Attention-deficit hyperactivity disorder, unspecified type: Secondary | ICD-10-CM

## 2013-09-18 MED ORDER — AMPHETAMINE-DEXTROAMPHET ER 20 MG PO CP24: 20.0000 mg | ORAL_CAPSULE | Freq: Two times a day (BID) | ORAL | Status: DC

## 2013-09-18 NOTE — Progress Notes (Signed)
   Subjective:    Patient ID: Latoya Olson, female    DOB: [birth year], 47 y.o.   MRN: [id number]  HPI Here to follow up. She is pleased with Lexapro and her moods are stable. The Adderall helps a lot but it runs out too quickly. She has tried taking a second dose in the early afternoon and this works well.    Review of Systems  Constitutional: Negative.   Neurological: Negative.   Psychiatric/Behavioral: Negative.        Objective:   Physical Exam  Constitutional: She is oriented to person, place, and time. She appears well-developed and well-nourished.  Cardiovascular: Normal rate, regular rhythm, normal heart sounds and intact distal pulses.   Pulmonary/Chest: Effort normal and breath sounds normal.  Neurological: She is alert and oriented to person, place, and time.          Assessment & Plan:  We will increase Adderall XR 20 mg to bid.

## 2013-09-24 ENCOUNTER — Telehealth: Payer: Self-pay | Admitting: Family Medicine

## 2013-09-24 ENCOUNTER — Encounter: Payer: Self-pay | Admitting: Internal Medicine

## 2013-09-24 ENCOUNTER — Other Ambulatory Visit: Payer: Self-pay | Admitting: Internal Medicine

## 2013-09-24 DIAGNOSIS — M359 Systemic involvement of connective tissue, unspecified: Secondary | ICD-10-CM

## 2013-09-24 MED ORDER — BUDESONIDE 3 MG PO CP24: 9.0000 mg | ORAL_CAPSULE | ORAL | Status: DC

## 2013-09-24 NOTE — Telephone Encounter (Signed)
error 

## 2013-09-24 NOTE — Assessment & Plan Note (Signed)
Recurrent sxs Restart budesonide 9 mg qd and see me in Jan/Feb

## 2013-09-26 ENCOUNTER — Telehealth: Payer: Self-pay | Admitting: Family Medicine

## 2013-09-26 NOTE — Telephone Encounter (Signed)
I received a fax from [facility] stating Amphe-Dextro 20 mg- 2's per day is denied.  It states the member can only received 1 capsule per day.  I called the company and was advised the plan will approve 30 mg-1x's per day, that is the max dosage.   I also called the pt and she agreed to try the 30 mg-- 1x's per day if you will approve the new RX.  She is leaving town tomorrow afternoon and would like to come pick the RX up in the morning, if possible please.

## 2013-09-27 MED ORDER — AMPHETAMINE-DEXTROAMPHET ER 30 MG PO CP24: 30.0000 mg | ORAL_CAPSULE | ORAL | Status: DC

## 2013-09-27 NOTE — Telephone Encounter (Signed)
I LMOM notifying pt RX is ready for pick-up.  Thanks a bunch

## 2013-09-27 NOTE — Telephone Encounter (Signed)
Can you let pt know this is ready?

## 2013-09-27 NOTE — Telephone Encounter (Signed)
done

## 2013-10-31 ENCOUNTER — Other Ambulatory Visit: Payer: Self-pay | Admitting: Obstetrics and Gynecology

## 2013-11-05 ENCOUNTER — Encounter: Payer: Self-pay | Admitting: Family Medicine

## 2013-11-21 ENCOUNTER — Telehealth: Payer: Self-pay | Admitting: Family Medicine

## 2013-11-21 MED ORDER — AMPHETAMINE-DEXTROAMPHET ER 30 MG PO CP24: 30.0000 mg | ORAL_CAPSULE | ORAL | Status: DC

## 2013-11-21 NOTE — Telephone Encounter (Signed)
Pt is needing new rx amphetamine-dextroamphetamine (ADDERALL XR) 30 MG 24 hr capsule, please call to advise when available for pick up.

## 2013-11-21 NOTE — Telephone Encounter (Signed)
done

## 2013-11-22 NOTE — Telephone Encounter (Signed)
Script is ready for pick up and I spoke with pt.  

## 2014-01-23 ENCOUNTER — Telehealth: Payer: Self-pay | Admitting: Family Medicine

## 2014-01-23 NOTE — Telephone Encounter (Signed)
Pt would like to go back to zoloft per pt lexapro is not as effective. [facility]. Please advise. Pt is aware md out of office today

## 2014-01-24 MED ORDER — SERTRALINE HCL 50 MG PO TABS: 50.0000 mg | ORAL_TABLET | Freq: Every day | ORAL | Status: DC

## 2014-01-24 NOTE — Telephone Encounter (Signed)
I sent script e-scribe and spoke with pt. 

## 2014-01-24 NOTE — Telephone Encounter (Signed)
Pt going out of town need respond today please

## 2014-01-24 NOTE — Telephone Encounter (Signed)
Stop Lexapro and call in Zoloft 50 mg daily, #30 with 2 rf. We can go up on the dose if she wishes

## 2014-01-27 ENCOUNTER — Telehealth: Payer: Self-pay | Admitting: Internal Medicine

## 2014-01-27 DIAGNOSIS — M359 Systemic involvement of connective tissue, unspecified: Secondary | ICD-10-CM

## 2014-01-27 MED ORDER — BUDESONIDE 3 MG PO CP24: 9.0000 mg | ORAL_CAPSULE | ORAL | Status: DC

## 2014-01-27 NOTE — Telephone Encounter (Signed)
LM on patients VM that [place] [facility] [place] as requested and to please keep her June appointment.

## 2014-01-27 NOTE — Telephone Encounter (Signed)
Ok to refill x  total Needs REV set up before we fill again

## 2014-01-27 NOTE — Telephone Encounter (Signed)
Ok to refill Sir? 

## 2014-02-17 ENCOUNTER — Other Ambulatory Visit: Payer: Self-pay | Admitting: Family Medicine

## 2014-02-24 ENCOUNTER — Telehealth: Payer: Self-pay | Admitting: Family Medicine

## 2014-02-24 MED ORDER — AMPHETAMINE-DEXTROAMPHET ER 30 MG PO CP24: 30.0000 mg | ORAL_CAPSULE | ORAL | Status: DC

## 2014-02-24 NOTE — Telephone Encounter (Signed)
Pt req rx amphetamine-dextroamphetamine (ADDERALL XR) 30 MG 24 hr capsule

## 2014-02-24 NOTE — Telephone Encounter (Signed)
done

## 2014-02-24 NOTE — Telephone Encounter (Signed)
Scripts are ready for pick up and I spoke with pt. 

## 2014-03-13 ENCOUNTER — Encounter: Payer: Self-pay | Admitting: Internal Medicine

## 2014-03-13 ENCOUNTER — Ambulatory Visit (INDEPENDENT_AMBULATORY_CARE_PROVIDER_SITE_OTHER): Payer: BC Managed Care – PPO | Admitting: Internal Medicine

## 2014-03-13 VITALS — BP 122/76 | HR 68 | Ht 64.0 in | Wt 161.5 lb

## 2014-03-13 DIAGNOSIS — F411 Generalized anxiety disorder: Secondary | ICD-10-CM

## 2014-03-13 DIAGNOSIS — M359 Systemic involvement of connective tissue, unspecified: Secondary | ICD-10-CM

## 2014-03-13 MED ORDER — HYOSCYAMINE SULFATE 0.125 MG SL SUBL: 0.1250 mg | SUBLINGUAL_TABLET | SUBLINGUAL | Status: DC | PRN

## 2014-03-13 MED ORDER — BUDESONIDE 3 MG PO CP24: 9.0000 mg | ORAL_CAPSULE | ORAL | Status: DC

## 2014-03-13 NOTE — Progress Notes (Signed)
         Subjective:    Patient ID: Latoya Olson, female    DOB: 08-21-1966, 48 y.o.   MRN: [id number]  HPI The patient is a for followup of collagenous colitis. She calls last month when she was in [place] when having a flare with diarrhea and crampy abdominal pain. I restarted budesonide at 9 mg daily. She says that helped some but not like in the past. She took it for a month ran out and has not restarted. She is concerned about a vague right flank discomfort as well. No urinary tract symptoms. Still having some diarrhea but overall improved. Concerned about her blood pressure possibly being up between the budesonide or her ADHD medication. Dizzy and somewhat stressful life being a single mom. Medications, allergies, past medical history, past surgical history, family history and social history are reviewed and updated in the EMR.   Review of Systems As above    Objective:   Physical Exam General:  NAD  Abdomen:  soft and nontender, BS+ Flank - right non-tender      Assessment & Plan:  COLLAGENOUS COLITIS Restart budesonide 9 mg qd RTC 2 months  Anxiety state, unspecified Health related, she was reassured. I don't think there is anything serious going on here.

## 2014-03-13 NOTE — Assessment & Plan Note (Addendum)
health related issues, she was reassured I don't think there is anything serious going on here.

## 2014-03-13 NOTE — Assessment & Plan Note (Signed)
Restart budesonide 9 mg qd RTC 2 months

## 2014-03-13 NOTE — Patient Instructions (Addendum)
We have sent the following medications to your pharmacy for you to pick up at your convenience: Entocort EC  Follow up with [place] in 2 months.  Appointment made for 05/28/14, 11:30am.   I appreciate the opportunity to care for you.

## 2014-04-09 ENCOUNTER — Other Ambulatory Visit: Payer: Self-pay | Admitting: Family Medicine

## 2014-04-16 NOTE — Telephone Encounter (Signed)
Refill for one year 

## 2014-05-02 ENCOUNTER — Telehealth: Payer: Self-pay | Admitting: Internal Medicine

## 2014-05-02 DIAGNOSIS — M359 Systemic involvement of connective tissue, unspecified: Secondary | ICD-10-CM

## 2014-05-02 MED ORDER — BUDESONIDE 3 MG PO CP24: 9.0000 mg | ORAL_CAPSULE | ORAL | Status: DC

## 2014-05-02 NOTE — Telephone Encounter (Signed)
Refill sent in as requested. 

## 2014-05-09 ENCOUNTER — Encounter: Payer: Self-pay | Admitting: Gastroenterology

## 2014-05-20 ENCOUNTER — Ambulatory Visit (INDEPENDENT_AMBULATORY_CARE_PROVIDER_SITE_OTHER): Payer: BC Managed Care – PPO | Admitting: Family Medicine

## 2014-05-20 ENCOUNTER — Encounter: Payer: Self-pay | Admitting: Family Medicine

## 2014-05-20 VITALS — BP 118/76 | HR 64 | Temp 98.1°F | Ht 64.0 in | Wt 160.0 lb

## 2014-05-20 DIAGNOSIS — F411 Generalized anxiety disorder: Secondary | ICD-10-CM

## 2014-05-20 DIAGNOSIS — F902 Attention-deficit hyperactivity disorder, combined type: Secondary | ICD-10-CM

## 2014-05-20 DIAGNOSIS — F909 Attention-deficit hyperactivity disorder, unspecified type: Secondary | ICD-10-CM

## 2014-05-20 MED ORDER — AMPHETAMINE-DEXTROAMPHET ER 30 MG PO CP24: 30.0000 mg | ORAL_CAPSULE | ORAL | Status: DC

## 2014-05-20 MED ORDER — AMPHETAMINE-DEXTROAMPHETAMINE 20 MG PO TABS: ORAL_TABLET | ORAL | Status: DC

## 2014-05-20 MED ORDER — SERTRALINE HCL 100 MG PO TABS: 100.0000 mg | ORAL_TABLET | Freq: Every day | ORAL | Status: DC

## 2014-05-20 NOTE — Progress Notes (Signed)
Pre visit review using our clinic review tool, if applicable. No additional management support is needed unless otherwise documented below in the visit note. 

## 2014-05-20 NOTE — Progress Notes (Signed)
   Subjective:    Patient ID: Latoya Olson, female    DOB: 1966-06-24, 48 y.o.   MRN: [id number]  HPI Here for several issues. First she has noticed some spells of palpitations in her chest while lying in bed at night. No chest pain or SOB. She never feels these during the daytime. She admits to a lot of anxiety lately and she tends to lose her temper quickly. She had done well on Adderall XR twice a day but her insurance company would not pay for this, so we switched to adderall XR 30 mg once daily. This works fine in the mornings but it wears off in the afternoon.    Review of Systems  Constitutional: Negative.   Respiratory: Negative.   Cardiovascular: Positive for palpitations. Negative for chest pain and leg swelling.  Neurological: Negative.   Psychiatric/Behavioral: Positive for agitation. Negative for hallucinations and dysphoric mood. The patient is nervous/anxious.        Objective:   Physical Exam  Constitutional: She is oriented to person, place, and time. She appears well-developed and well-nourished.  Cardiovascular: Normal rate, regular rhythm, normal heart sounds and intact distal pulses.   Pulmonary/Chest: Effort normal and breath sounds normal.  Neurological: She is alert and oriented to person, place, and time.          Assessment & Plan:  I think her night time palpitations are all from anxiety, so we will increase her Zoloft to 100 mg daily. We will add immediate release Adderall 20 mg in the afternoons to her XR in the mornings.

## 2014-05-28 ENCOUNTER — Encounter: Payer: Self-pay | Admitting: Internal Medicine

## 2014-05-28 ENCOUNTER — Ambulatory Visit (INDEPENDENT_AMBULATORY_CARE_PROVIDER_SITE_OTHER)
Admission: RE | Admit: 2014-05-28 | Discharge: 2014-05-28 | Disposition: A | Discharge status: Home / Self Care | Payer: BC Managed Care – PPO | Source: Ambulatory Visit | Attending: Internal Medicine | Admitting: Internal Medicine

## 2014-05-28 ENCOUNTER — Ambulatory Visit (INDEPENDENT_AMBULATORY_CARE_PROVIDER_SITE_OTHER): Payer: BC Managed Care – PPO | Admitting: Internal Medicine

## 2014-05-28 VITALS — BP 110/76 | HR 64 | Ht 64.0 in | Wt 160.4 lb

## 2014-05-28 DIAGNOSIS — IMO0002 Reserved for concepts with insufficient information to code with codable children: Secondary | ICD-10-CM

## 2014-05-28 DIAGNOSIS — Z7952 Long term (current) use of systemic steroids: Secondary | ICD-10-CM

## 2014-05-28 DIAGNOSIS — M359 Systemic involvement of connective tissue, unspecified: Secondary | ICD-10-CM

## 2014-05-28 NOTE — Progress Notes (Signed)
   Subjective:    Patient ID: Latoya Olson, female    DOB: 08-17-1966, 48 y.o.   MRN: [id number]  HPI Here for f/u collagenous colitis and doing well on 6 mg budesonide daily. Concerned about possible long-term effects of budesonide. Getting ready for kids to start high school.  Medications, allergies, past medical history, past surgical history, family history and social history are reviewed and updated in the EMR.   Review of Systems As above    Objective:   Physical Exam NAD    Assessment & Plan:  COLLAGENOUS COLITIS Better on 6 mg budesonide daily Taper to 3 mg daily when ready  Long term current use of systemic steroids DEXA Does not want vit D level (phlebotomy)  Start Ca 1200 mg and vit D 1000IU daily  Potential side effects of osteoporosis, htn, cataracts, hip problems, reviewed. Goal will be to try to wean off but if cannot stay in remission could need long-term vs. Other immunosuppressant.

## 2014-05-28 NOTE — Patient Instructions (Addendum)
Please go down stairs to our radiology dept to get a DEXA scan done.   Decrease your budesonide to 3mg  daily when your ready.  Please take a Calcium 1200mg /Vitamin D 1000 IU supplement daily.   I appreciate the opportunity to care for you.

## 2014-05-28 NOTE — Assessment & Plan Note (Addendum)
Better on 6 mg budesonide daily Taper to 3 mg daily when ready Then let me know how its going after 1 month at 3 mg

## 2014-05-28 NOTE — Assessment & Plan Note (Addendum)
DEXA Does not want vit D level (phlebotomy)  Start Ca 1200 mg and vit D 1000IU daily  Potential side effects of osteoporosis, htn, cataracts, hip problems, reviewed. Goal will be to try to wean off but if cannot stay in remission could need long-term vs. Other immunosuppressant.

## 2014-06-02 NOTE — Progress Notes (Signed)
Quick Note:  Notified by My Chart ______ 

## 2014-08-25 ENCOUNTER — Telehealth: Payer: Self-pay | Admitting: Family Medicine

## 2014-08-25 NOTE — Telephone Encounter (Signed)
Pt requesting refill of:  amphetamine-dextroamphetamine (ADDERALL XR) 30 MG 24 hr capsule amphetamine-dextroamphetamine (ADDERALL) 20 MG tablet

## 2014-08-26 MED ORDER — AMPHETAMINE-DEXTROAMPHETAMINE 20 MG PO TABS: ORAL_TABLET | ORAL | Status: DC

## 2014-08-26 MED ORDER — AMPHETAMINE-DEXTROAMPHET ER 30 MG PO CP24: 30.0000 mg | ORAL_CAPSULE | ORAL | Status: DC

## 2014-08-26 NOTE — Telephone Encounter (Signed)
Script is ready for pick up and I spoke with pt.  

## 2014-08-26 NOTE — Telephone Encounter (Signed)
done

## 2014-08-26 NOTE — Telephone Encounter (Signed)
Pt is out °

## 2014-08-26 NOTE — Addendum Note (Signed)
Addended by: Gershon CraneFRY, Niyah Mamaril A on: 08/26/2014 02:10 PM   Modules accepted: Orders

## 2014-11-12 ENCOUNTER — Other Ambulatory Visit: Payer: Self-pay | Admitting: Internal Medicine

## 2014-11-12 ENCOUNTER — Encounter: Payer: Self-pay | Admitting: Internal Medicine

## 2014-11-12 DIAGNOSIS — K52831 Collagenous colitis: Secondary | ICD-10-CM

## 2014-11-12 MED ORDER — BUDESONIDE 3 MG PO CP24: 9.0000 mg | ORAL_CAPSULE | ORAL | Status: DC

## 2014-11-12 NOTE — Assessment & Plan Note (Signed)
Flaring as she came off budesonide Will restart at 9 mg daily

## 2014-11-13 ENCOUNTER — Encounter: Payer: Self-pay | Admitting: Internal Medicine

## 2014-11-13 ENCOUNTER — Telehealth: Payer: Self-pay | Admitting: Family Medicine

## 2014-11-13 NOTE — Telephone Encounter (Signed)
Pt needs new rx generic adderall xr 30 mg and generic adderall 20 mg °

## 2014-11-14 MED ORDER — AMPHETAMINE-DEXTROAMPHET ER 30 MG PO CP24: 30.0000 mg | ORAL_CAPSULE | ORAL | Status: DC

## 2014-11-14 MED ORDER — AMPHETAMINE-DEXTROAMPHETAMINE 20 MG PO TABS: ORAL_TABLET | ORAL | Status: DC

## 2014-11-14 NOTE — Telephone Encounter (Signed)
done

## 2014-11-14 NOTE — Telephone Encounter (Signed)
Script is ready for pick up and I left a voice message.  

## 2014-11-17 ENCOUNTER — Encounter: Payer: Self-pay | Admitting: Obstetrics and Gynecology

## 2014-11-17 ENCOUNTER — Ambulatory Visit (INDEPENDENT_AMBULATORY_CARE_PROVIDER_SITE_OTHER): Payer: BLUE CROSS/BLUE SHIELD | Admitting: Obstetrics and Gynecology

## 2014-11-17 VITALS — BP 120/82 | HR 76 | Resp 14 | Ht 64.25 in | Wt 166.6 lb

## 2014-11-17 DIAGNOSIS — Z0000 Encounter for general adult medical examination without abnormal findings: Secondary | ICD-10-CM

## 2014-11-17 DIAGNOSIS — Z01419 Encounter for gynecological examination (general) (routine) without abnormal findings: Secondary | ICD-10-CM

## 2014-11-17 LAB — POCT URINALYSIS DIPSTICK
Bilirubin, UA: NEGATIVE
Blood, UA: NEGATIVE
GLUCOSE UA: NEGATIVE
KETONES UA: NEGATIVE
LEUKOCYTES UA: NEGATIVE
Nitrite, UA: NEGATIVE
PROTEIN UA: NEGATIVE
UROBILINOGEN UA: NEGATIVE
pH, UA: 5

## 2014-11-17 LAB — CBC
HCT: 35.5 % — ABNORMAL LOW (ref 36.0–46.0)
HEMOGLOBIN: 12.1 g/dL (ref 12.0–15.0)
MCH: 31.5 pg (ref 26.0–34.0)
MCHC: 34.1 g/dL (ref 30.0–36.0)
MCV: 92.4 fL (ref 78.0–100.0)
MPV: 8.7 fL (ref 8.6–12.4)
Platelets: 258 10*3/uL (ref 150–400)
RBC: 3.84 MIL/uL — ABNORMAL LOW (ref 3.87–5.11)
RDW: 13 % (ref 11.5–15.5)
WBC: 7 10*3/uL (ref 4.0–10.5)

## 2014-11-17 LAB — LIPID PANEL
Cholesterol: 216 mg/dL — ABNORMAL HIGH (ref 0–200)
HDL: 76 mg/dL (ref >39)
LDL Cholesterol: 123 mg/dL — ABNORMAL HIGH (ref 0–99)
TRIGLYCERIDES: 86 mg/dL (ref <150)
Total CHOL/HDL Ratio: 2.8 Ratio
VLDL: 17 mg/dL (ref 0–40)

## 2014-11-17 LAB — COMPREHENSIVE METABOLIC PANEL
ALT: 20 U/L (ref 0–35)
AST: 18 U/L (ref 0–37)
Albumin: 4.3 g/dL (ref 3.5–5.2)
Alkaline Phosphatase: 49 U/L (ref 39–117)
BUN: 16 mg/dL (ref 6–23)
CO2: 30 meq/L (ref 19–32)
Calcium: 9.1 mg/dL (ref 8.4–10.5)
Chloride: 100 mEq/L (ref 96–112)
Creat: 0.56 mg/dL (ref 0.50–1.10)
GLUCOSE: 95 mg/dL (ref 70–99)
Potassium: 4.1 mEq/L (ref 3.5–5.3)
Sodium: 137 mEq/L (ref 135–145)
Total Bilirubin: 0.4 mg/dL (ref 0.2–1.2)
Total Protein: 6.9 g/dL (ref 6.0–8.3)

## 2014-11-17 LAB — HEMOGLOBIN, FINGERSTICK: HEMOGLOBIN, FINGERSTICK: 11.8 g/dL — AB (ref 12.0–16.0)

## 2014-11-17 NOTE — Patient Instructions (Signed)

## 2014-11-17 NOTE — Progress Notes (Signed)
Patient ID: Latoya Olson, female   DOB: Jan 23, 1966, 49 y.o.   MRN: [id number] 49 y.o. [id number] Legally SeparatedCaucasianF here for annual exam.    Having night sweats.  Menses every 18? - 22 days.  Average is 22 days.  Not heavy.  Can skip a month.  Has acne.   Used Yaz in the past and legs felt heavy so stopped it.   Breast pain more on the right with last cycle.  Pain now resolved.   Some palpitations when sleeping.  Wakes up with this feeling.  PCP aware.   Has colitis.  Diarrhea symptoms.  Divorce just final.  2 children.   PCP:  Larey Dresser, MD - prescribing medication.   Patient's last menstrual period was 11/09/2014 (exact date).          Sexually active: No.female  The current method of family planning is abstinence.   Exercising: No.  none. Smoker:  no  Health Maintenance: Pap:  10/2013 wnl History of abnormal Pap:  no MMG:  10/2013 with [facility]--wnl:read at [facility]. Colonoscopy:  10/2007 with Dr. Harrington Challenger colitis BMD:   2015 wnl with Dr. Leone Payor TDaP:  Up to date with PCP Screening Labs:   Hb today: 11.8, Urine today: Neg   reports that she has never smoked. She has never used smokeless tobacco. She reports that she drinks about 8.4 oz of alcohol per week. She reports that she does not use illicit drugs.  Past Medical History  Diagnosis Date  . Anxiety   . Premenstrual dysphoric disorder   . ADHD (attention deficit hyperactivity disorder)   . Collagenous colitis 2009    sees Dr. Stan Head   . Hemorrhoids   . Dysmenorrhea     Past Surgical History  Procedure Laterality Date  . Colonoscopy  10/23/07    [facility]    Current Outpatient Prescriptions  Medication Sig Dispense Refill  . amphetamine-dextroamphetamine (ADDERALL XR) 30 MG 24 hr capsule Take 1 capsule (30 mg total) by mouth every morning. 30 capsule 0  . amphetamine-dextroamphetamine (ADDERALL) 20 MG tablet Daily in the evenings 30 tablet 0  . budesonide  (ENTOCORT EC) 3 MG 24 hr capsule Take 3 capsules (9 mg total) by mouth every morning. 270 capsule 0  . ibuprofen (ADVIL,MOTRIN) 200 MG tablet Take 200 mg by mouth every 6 (six) hours as needed for pain.    Marland Kitchen sertraline (ZOLOFT) 100 MG tablet Take 1 tablet (100 mg total) by mouth daily. 90 tablet 3   No current facility-administered medications for this visit.    Family History  Problem Relation Age of Onset  . Coronary artery disease    . Lung cancer    . Lymphoma Mother   . Colon cancer Mother     --anal CA--  . Lupus Mother   . Congestive Heart Failure Mother 36    DEC from CHF  . Cancer Father 41    DEC from squamous cell CA  . Hypertension Father     ROS:  Pertinent items are noted in HPI.  Otherwise, a comprehensive ROS was negative.  Exam:   BP 120/82 mmHg  Pulse 76  Resp 14  Ht 5' 4.25" (1.632 m)  Wt 166 lb 9.6 oz (75.569 kg)  BMI 28.37 kg/m2  LMP 11/09/2014 (Exact Date)    Height: 5' 4.25" (163.2 cm)  Ht Readings from Last 3 Encounters:  11/17/14 5' 4.25" (1.632 m)  05/28/14  (1.626 m)  05/20/14 5\' 4"  (1.626 m)    General appearance: alert, cooperative and appears stated age Head: Normocephalic, without obvious abnormality, atraumatic Neck: no adenopathy, supple, symmetrical, trachea midline and thyroid normal to inspection and palpation Lungs: clear to auscultation bilaterally Breasts: normal appearance, no masses or tenderness, Inspection negative, No nipple retraction or dimpling, No nipple discharge or bleeding, No axillary or supraclavicular adenopathy Heart: regular rate and rhythm Abdomen: soft, non-tender; bowel sounds normal; no masses,  no organomegaly Extremities: extremities normal, atraumatic, no cyanosis or edema Skin: Skin color, texture, turgor normal. No rashes or lesions Lymph nodes: Cervical, supraclavicular, and axillary nodes normal. No abnormal inguinal nodes palpated Neurologic: Grossly normal   Pelvic: External genitalia:  no  lesions              Urethra:  normal appearing urethra with no masses, tenderness or lesions              Bartholins and Skenes: normal                 Vagina: normal appearing vagina with normal color and discharge, no lesions              Cervix: no lesions              Pap taken: Yes.   Bimanual Exam:  Uterus:  normal size, contour, position, consistency, mobility, non-tender              Adnexa: normal adnexa and no mass, fullness, tenderness               Rectovaginal: Confirms               Anus:  normal sphincter tone, no lesions  Chaperone was present for exam.  A:  Well Woman with normal exam Perimenopausal female.  Some cycles closer together.  Not making exactly when they are occurring. Right breast pain - resolved.  Overweight.   P:   Mammogram due.  Will schedule.  pap smear and HR HPV.  Discussed potential use of LoLoEstrin. If cycles are really close together, will need ultrasound and possible EMB first.  Handout on perimenopause.  Discussed weight loss.  Lipids, CMP, CBC, TSH.  return annually or prn

## 2014-11-18 ENCOUNTER — Other Ambulatory Visit: Payer: Self-pay

## 2014-11-18 DIAGNOSIS — Z1231 Encounter for screening mammogram for malignant neoplasm of breast: Secondary | ICD-10-CM

## 2014-11-18 LAB — TSH: TSH: 2.766 u[IU]/mL (ref 0.350–4.500)

## 2014-11-19 LAB — IPS PAP TEST WITH HPV

## 2014-11-22 ENCOUNTER — Telehealth: Payer: Self-pay | Admitting: Family Medicine

## 2014-11-23 NOTE — Telephone Encounter (Signed)
PLEASE NOTE: All timestamps contained within this report are represented as [place] Standard Time. CONFIDENTIALTY NOTICE: This fax transmission is intended only for the addressee. It contains information that is legally privileged, confidential or otherwise protected from use or disclosure. If you are not the intended recipient, you are strictly prohibited from reviewing, disclosing, copying using or disseminating any of this information or taking any action in reliance on or regarding this information. If you have received this fax in error, please notify us immediately by telephone so that we can arrange for its return to [place]. Phone: [phone], Toll-Free: [phone], Fax: [phone] Page: 1 of 2 Call Id: [id number] [facility] Night - Client TELEPHONE ADVICE RECORD [facility] Patient Name: Latoya Olson Gender: Female DOB: January 02, 1966 Age: 49 Y 11 M 18 D Return Phone Number: [phone] (Primary) Address: [address] City/State/Zip: [place] [address] Client [facility] Night - Client Client Site [facility] - Night Physician Gershon Crane Contact Type Call Call Type Triage / Clinical Relationship To Patient Self Return Phone Number [phone] (Primary) Chief Complaint Cough Initial Comment Caller states, feels bad for 4 days, chest congestion, cough, bad headache, cold symptoms PreDisposition Go to Urgent Care/Walk-In Clinic Nurse Assessment Nurse: Phylliss Bob, RN, Synetta Fail Date/Time Lamount Cohen Time): 11/22/2014 11:10:59 AM Confirm and document reason for call. If symptomatic, describe symptoms. ---Caller states, feels bad for 4 days, chest congestion, cough, bad headache, cold symptoms caller stated that she has had no fevers and has had a runny nose with yellow drainage and headache that is in the back on the head behind the ear and has ear pain and has a productive cough with yellow phlegm and has  no difficulty with her breathing Has the patient traveled out of the country within the last 30 days? ---No Does the patient require triage? ---Yes Related visit to physician within the last 2 weeks? ---No Does the PT have any chronic conditions? (i.e. diabetes, asthma, etc.) ---No Did the patient indicate they were pregnant? ---No Guidelines Guideline Title Affirmed Question Affirmed Notes Nurse Date/Time Lamount Cohen Time) Cough - Acute Productive Charmaine Downs, RN, Synetta Fail 11/22/2014 11:13:23 AM Disp. Time Lamount Cohen Time) Disposition Final User 11/22/2014 11:16:38 AM See Physician within 24 Hours Yes Phylliss Bob, RN, Rudell Cobb Understands: Yes Disagree/Comply: Comply PLEASE NOTE: All timestamps contained within this report are represented as [place] Standard Time. CONFIDENTIALTY NOTICE: This fax transmission is intended only for the addressee. It contains information that is legally privileged, confidential or otherwise protected from use or disclosure. If you are not the intended recipient, you are strictly prohibited from reviewing, disclosing, copying using or disseminating any of this information or taking any action in reliance on or regarding this information. If you have received this fax in error, please notify us immediately by telephone so that we can arrange for its return to [place]. Phone: [phone], Toll-Free: [phone], Fax: [phone] Page: 2 of 2 Call Id: [id number] Care Advice Given Per Guideline SEE PHYSICIAN WITHIN 24 HOURS: * IF OFFICE WILL BE CLOSED AND NO PCP TRIAGE: You need to be examined within the next 24 hours. Go to _________ at your convenience. PAIN MEDICINES: * For pain relief, take acetaminophen, ibuprofen, or naproxen. * Use the lowest amount that makes your pain feel better. ACETAMINOPHEN (E.G., TYLENOL): * Take 650 mg (two 325 mg pills) by mouth every 4-6 hours as needed. Each Regular Strength Tylenol pill has 325 mg of acetaminophen. The most you should take  each day  is 3,250 mg (10 Regular Strength pills a day). * Suck on cough drops or hard candy to coat the irritated throat. * Drink warm fluids. Inhale warm mist. (Reason: both relax the airway and loosen up the phlegm) COUGHING SPELLS: COUGH DROPS FOR COUGH: * Cough drops can help a lot, especially for mild coughs. They reduce coughing by soothing your irritated throat and removing that tickle sensation in the back of the throat. * Cough drops also have the advantage of portability - you can carry them with you. CALL BACK IF: * Difficulty breathing occurs * You become worse. CARE ADVICE given per Cough - Acute Productive (Adult) guideline. After Care Instructions Given Call Event Type User Date / Time Description Referrals [facility]

## 2014-11-25 ENCOUNTER — Ambulatory Visit
Admission: RE | Admit: 2014-11-25 | Discharge: 2014-11-25 | Disposition: A | Discharge status: Home / Self Care | Payer: BLUE CROSS/BLUE SHIELD | Source: Ambulatory Visit

## 2014-11-25 DIAGNOSIS — Z1231 Encounter for screening mammogram for malignant neoplasm of breast: Secondary | ICD-10-CM

## 2014-12-26 ENCOUNTER — Encounter: Payer: Self-pay | Admitting: Internal Medicine

## 2015-02-17 ENCOUNTER — Telehealth: Payer: Self-pay | Admitting: Family Medicine

## 2015-02-17 ENCOUNTER — Telehealth: Payer: Self-pay | Admitting: Internal Medicine

## 2015-02-17 DIAGNOSIS — K52831 Collagenous colitis: Secondary | ICD-10-CM

## 2015-02-17 MED ORDER — AMPHETAMINE-DEXTROAMPHET ER 30 MG PO CP24: 30.0000 mg | ORAL_CAPSULE | ORAL | Status: DC

## 2015-02-17 MED ORDER — AMPHETAMINE-DEXTROAMPHETAMINE 20 MG PO TABS: ORAL_TABLET | ORAL | Status: DC

## 2015-02-17 MED ORDER — BUDESONIDE 3 MG PO CPEP: 9.0000 mg | ORAL_CAPSULE | Freq: Every day | ORAL | Status: DC

## 2015-02-17 NOTE — Telephone Encounter (Signed)
Pt needs new rxs generic adderall xr 30 mg and 20 mg.

## 2015-02-17 NOTE — Telephone Encounter (Signed)
done

## 2015-02-17 NOTE — Telephone Encounter (Signed)
Script is ready for pick up and I left a voice message for pt. 

## 2015-02-17 NOTE — Telephone Encounter (Signed)
Refill sent in as requested. 

## 2015-03-30 ENCOUNTER — Telehealth: Payer: Self-pay | Admitting: Obstetrics and Gynecology

## 2015-03-30 DIAGNOSIS — N926 Irregular menstruation, unspecified: Secondary | ICD-10-CM

## 2015-03-30 NOTE — Telephone Encounter (Signed)
Patient having some problems with her cycle.

## 2015-03-30 NOTE — Telephone Encounter (Signed)
Spoke with patient. Patient was last seen on 11/17/2014 for aex with Dr.Silva. Patient has been tracking her cycles and they are occuring every 21 days. Per OV note if cycles are occuring close together needs to be seen for PUS and possible EMB. Patient is agreeable to schedule at this time. Ultrasound appointment scheduled for 6/23 at 8:30am with 9am consult with Dr.Silva. Patient is agreeable to date and time. Order placed for precert with possible EMB.  Cc: Braxton Feathers  Routing to provider for final review. Patient agreeable to disposition. Will close encounter.

## 2015-03-30 NOTE — Telephone Encounter (Signed)
Left message to call Latoya Olson at [phone]. 

## 2015-03-31 ENCOUNTER — Ambulatory Visit (INDEPENDENT_AMBULATORY_CARE_PROVIDER_SITE_OTHER): Payer: BLUE CROSS/BLUE SHIELD | Admitting: Family Medicine

## 2015-03-31 ENCOUNTER — Encounter: Payer: Self-pay | Admitting: Family Medicine

## 2015-03-31 VITALS — BP 127/78 | HR 65 | Temp 99.1°F | Ht 64.25 in | Wt 170.0 lb

## 2015-03-31 DIAGNOSIS — R030 Elevated blood-pressure reading, without diagnosis of hypertension: Secondary | ICD-10-CM

## 2015-03-31 DIAGNOSIS — IMO0001 Reserved for inherently not codable concepts without codable children: Secondary | ICD-10-CM

## 2015-03-31 NOTE — Progress Notes (Signed)
Pre visit review using our clinic review tool, if applicable. No additional management support is needed unless otherwise documented below in the visit note. 

## 2015-04-01 ENCOUNTER — Encounter: Payer: Self-pay | Admitting: Family Medicine

## 2015-04-01 NOTE — Progress Notes (Signed)
   Subjective:    Patient ID: Latoya Olson, female    DOB: Feb 21, 1966, 49 y.o.   MRN: [id number]  HPI Here to check on her BP. She has no hx of HTN but earlier this week while at her GYN office, her BP was checked at 146/90. She felt fine but was advised to see [place] about it. Again today she feels fine.    Review of Systems  Constitutional: Negative.   HENT: Negative.   Eyes: Negative.   Respiratory: Negative.   Cardiovascular: Negative.   Neurological: Negative.        Objective:   Physical Exam  Constitutional: She is oriented to person, place, and time. She appears well-developed and well-nourished.  Neck: No thyromegaly present.  Cardiovascular: Normal rate, regular rhythm, normal heart sounds and intact distal pulses.   Pulmonary/Chest: Effort normal and breath sounds normal.  Lymphadenopathy:    She has no cervical adenopathy.  Neurological: She is alert and oriented to person, place, and time.          Assessment & Plan:  She seems to be fine. She has had a single isolated high BP reading but I do not think this will be a problem. I advised her to get exercise and to watch her sodium intake. She will check her BP twice a week and follow up prn

## 2015-04-02 ENCOUNTER — Ambulatory Visit (INDEPENDENT_AMBULATORY_CARE_PROVIDER_SITE_OTHER): Payer: BLUE CROSS/BLUE SHIELD | Admitting: Obstetrics and Gynecology

## 2015-04-02 ENCOUNTER — Ambulatory Visit (INDEPENDENT_AMBULATORY_CARE_PROVIDER_SITE_OTHER): Payer: BLUE CROSS/BLUE SHIELD

## 2015-04-02 ENCOUNTER — Encounter: Payer: Self-pay | Admitting: Obstetrics and Gynecology

## 2015-04-02 VITALS — BP 110/68 | HR 60 | Ht 64.25 in | Wt 167.0 lb

## 2015-04-02 DIAGNOSIS — N951 Menopausal and female climacteric states: Secondary | ICD-10-CM | POA: Diagnosis not present

## 2015-04-02 DIAGNOSIS — N926 Irregular menstruation, unspecified: Secondary | ICD-10-CM | POA: Diagnosis not present

## 2015-04-02 NOTE — Patient Instructions (Signed)
Menopause and Herbal Products Menopause is the normal time of life when menstrual periods stop completely. Menopause is complete when you have missed 12 consecutive menstrual periods. It usually occurs between the ages of 48 to 55, with an average age of 51. Very rarely does a woman develop menopause before 50 years old. At menopause, your ovaries stop producing the female hormones, estrogen and progesterone. This can cause undesirable symptoms and also affect your health. Sometimes the symptoms can occur 4 to 5 years before the menopause begins. There is no relationship between menopause and:  Oral contraceptives.  Number of children you had.  Race.  The age your menstrual periods started (menarche). Heavy smokers and very thin women may develop menopause earlier in life. Estrogen and progesterone hormone treatment is the usual method of treating menopausal symptoms. However, there are women who should not take hormone treatment. This is true of:   Women that have breast or uterine cancer.  Women who prefer not to take hormones because of certain side effects (abnormal uterine bleeding).  Women who are afraid that hormones may cause breast cancer.  Women who have a history of liver disease, heart disease, stroke, or blood clots. For these women, there are other medications that may help treat their menopausal symptoms. These medications are found in plants and botanical products. They can be found in the form of herbs, teas, oils, tinctures, and pills.  CAUSES:  The ovaries stop producing the female hormones estrogen and progesterone.  Other causes include:  Surgery to remove both ovaries.  The ovaries stop functioning for no know reason.  Tumors of the pituitary gland in the brain.  Medical disease that affects the ovaries and hormone production.  Radiation treatment to the abdomen or pelvis.  Chemotherapy that affects the ovaries. PHYTOESTROGENS: Phytoestrogens occur  naturally in plants and plant products. They act like estrogen in the body. Herbal medications are made from these plants and botanical steroids. There are 3 types of phytoestrogens:  Isoflavones (genistein and daidzein) are found in soy, garbanzo beans, miso and tofu foods.  Ligins are found in the shell of seeds. They are used to make oils like flaxseed oil. The bacteria in your intestine act on these foods to produce the estrogen-like hormones.  Coumestans are estrogen-like. Some of the foods they are found in include sunflower seeds and bean sprouts. CONDITIONS AND THEIR POSSIBLE HERBAL TREATMENT:  Hot flashes and night sweats.  Soy, black cohosh and evening primrose.  Irritability, insomnia, depression and memory problems.  Chasteberry, ginseng, and soy.  St. John's wort may be helpful for depression. However, there is a concern of it causing cataracts of the eye and may have bad effects on other medications. St. John's wort should not be taken for long time and without your caregiver's advice.  Loss of libido and vaginal and skin dryness.  Wild yam and soy.  Prevention of coronary heart disease and osteoporosis.  Soy and Isoflavones. Several studies have shown that some women benefit from herbal medications, but most of the studies have not consistently shown that these supplements are much better than placebo. Other forms of treatment to help women with menopausal symptoms include a balanced diet, rest, exercise, vitamin and calcium (with vitamin D) supplements, acupuncture, and group therapy when necessary. THOSE WHO SHOULD NOT TAKE HERBAL MEDICATIONS INCLUDE:  Women who are planning on getting pregnant unless told by your caregiver.  Women who are breastfeeding unless told by your caregiver.  Women who are taking other   prescription medications unless told by your caregiver.  Infants, children, and elderly women unless told by your caregiver. Different herbal medications  have different and unmeasured amounts of the herbal ingredients. There are no regulations, quality control, and standardization of the ingredients in herbal medications. Therefore, the amount of the ingredient in the medication may vary from one herb, pill, tea, oil or tincture to another. Many herbal medications can cause serious problems and can even have poisonous effects if taken too much or too long. If problems develop, the medication should be stopped and recorded by your caregiver. HOME CARE INSTRUCTIONS  Do not take or give children herbal medications without your caregiver's advice.  Let your caregiver know all the medications you are taking. This includes prescription, over-the-counter, eye drops, and creams.  Do not take herbal medications longer or more than recommended.  Tell your caregiver about any side effects from the medication. SEEK MEDICAL CARE IF:  You develop a fever of 102 F (38.9 C), or as directed by your caregiver.  You feel sick to your stomach (nauseous), vomit, or have diarrhea.  You develop a rash.  You develop abdominal pain.  You develop severe headaches.  You start to have vision problems.  You feel dizzy or faint.  You start to feel numbness in any part of your body.  You start shaking (have convulsions). Document Released: 03/14/2008 Document Revised: 09/12/2012 Document Reviewed: 10/12/2010 ExitCare Patient Information 2015 [employer]. This information is not intended to replace advice given to you by your health care provider. Make sure you discuss any questions you have with your health care provider.  

## 2015-04-02 NOTE — Progress Notes (Signed)
Subjective  49 y.o. [id number]  Caucasianfemale here for pelvic ultrasound for irregular menses.  Having night sweats.  Menses every 21 - 23 days now that patient is calculating more clearly.  Not heavy. can be lighter flow. Can skip a month.  Has acne.  Having PMS symptoms. Has elevated BP at time.  Not on medication.  Having palpitations.  Has not taken birth control in years. Has not used an IUD in the past.  Takes Zoloft but does not take it regularly.  Usually takes it when she is not feeling well and is feeling irritated.  Saw PCP who said that palpitations is part of perimenopause.  Objective  Pelvic ultrasound images and report reviewed with patient.  Uterus - no masses. EMS - 5.35 mm. Endocervical 9 x 5 mm area with possible vascular low - endocervical polyp? Ovaries - normal with bilateral follicles. Free fluid - no        Assessment  Short but normal cycles.  Endocervical area likely polyp.  Does not need removal.  Perimenopause.  Plan  Discussion of ultrasound findings.  Discussion of endometrial polyps.  Discussion of perimenopause.  I recommend taking Zoloft regularly 100 mg daily.  Discussed herbal therapy for menopause also. Return of perimenopausal symptoms persist and need to consider low dose OCPs.  __25_____ minutes face to face time of which over 50% was spent in counseling.   After visit summary to patient.

## 2015-04-16 ENCOUNTER — Telehealth: Payer: Self-pay | Admitting: *Deleted

## 2015-04-16 NOTE — Telephone Encounter (Signed)
Order completed. Encounter closed.  

## 2015-04-16 NOTE — Telephone Encounter (Signed)
Yes, please discontinue order for endometrial biopsy.  This is not needed.

## 2015-04-16 NOTE — Telephone Encounter (Signed)
Phone note from 03-30-15, possible endometrial biopsy was ordered. Patient seen on 04-02-15. Pelvic ultrasound completed. Cervical polyp and perimenopausal changes. Can endometrial biopsy order be removed?

## 2015-05-28 ENCOUNTER — Other Ambulatory Visit: Payer: Self-pay | Admitting: Family Medicine

## 2015-05-28 NOTE — Telephone Encounter (Signed)
Pt calling to request refills of the following:  amphetamine-dextroamphetamine (ADDERALL XR) 30 MG 24 hr capsule amphetamine-dextroamphetamine (ADDERALL) 20 MG tablet sertraline (ZOLOFT) 100 MG tablet  Please call when ready for pick-up.

## 2015-05-29 MED ORDER — AMPHETAMINE-DEXTROAMPHETAMINE 20 MG PO TABS: ORAL_TABLET | ORAL | Status: DC

## 2015-05-29 MED ORDER — AMPHETAMINE-DEXTROAMPHET ER 30 MG PO CP24: 30.0000 mg | ORAL_CAPSULE | ORAL | Status: DC

## 2015-05-29 MED ORDER — SERTRALINE HCL 100 MG PO TABS: 100.0000 mg | ORAL_TABLET | Freq: Every day | ORAL | Status: DC

## 2015-05-29 NOTE — Telephone Encounter (Signed)
done

## 2015-05-29 NOTE — Telephone Encounter (Signed)
Called and left a message for pt that prescriptions are ready for pickup.  

## 2015-06-16 ENCOUNTER — Telehealth: Payer: Self-pay | Admitting: Obstetrics and Gynecology

## 2015-06-16 ENCOUNTER — Encounter: Payer: Self-pay | Admitting: Nurse Practitioner

## 2015-06-16 ENCOUNTER — Ambulatory Visit (INDEPENDENT_AMBULATORY_CARE_PROVIDER_SITE_OTHER): Payer: BLUE CROSS/BLUE SHIELD | Admitting: Nurse Practitioner

## 2015-06-16 VITALS — BP 110/70 | HR 68 | Resp 16 | Wt 165.0 lb

## 2015-06-16 DIAGNOSIS — R300 Dysuria: Secondary | ICD-10-CM | POA: Diagnosis not present

## 2015-06-16 DIAGNOSIS — Z3009 Encounter for other general counseling and advice on contraception: Secondary | ICD-10-CM

## 2015-06-16 MED ORDER — NORETHIN-ETH ESTRAD-FE BIPHAS 1 MG-10 MCG / 10 MCG PO TABS: 1.0000 | ORAL_TABLET | Freq: Every day | ORAL | Status: DC

## 2015-06-16 MED ORDER — NITROFURANTOIN MONOHYD MACRO 100 MG PO CAPS: 100.0000 mg | ORAL_CAPSULE | Freq: Two times a day (BID) | ORAL | Status: DC

## 2015-06-16 NOTE — Telephone Encounter (Signed)
Spoke with patient. Patient states that she has been experiencing increased urinary frequency and burning with urination that started yesterday. Patient started taking AZO for symptoms with some relief. Denies any fever, chills, or lower back pain. Advised will need to be seen in office for further evaluation. Patient is agreeable. Appointment scheduled for today at 12:45 pm with Leota Sauers CNM. Agreeable to date and time.  Routing to provider for final review. Patient agreeable to disposition. Will close encounter.

## 2015-06-16 NOTE — Telephone Encounter (Signed)
Patient called and said, "I am having symptoms of a urinary tract infection." She declined an appointment until speaking with the nurse as she is not available to come in until tomorrow.

## 2015-06-16 NOTE — Progress Notes (Signed)
S:  49 y.o.Divorced white female G2P2002 presents with complaint of UTI. Symptoms began on  06/15/15 early am. With symptoms of burning with urination, dysuria, urinary frequency, urinary urgency. Pertinent negatives include The patient is having no constitutional symptoms, denying fever, chills, anorexia, or weight loss.. Sexually active yes  Symptoms related to post coital Yes.  Current method of birth control is condoms but desires other contraception.  Vaginal dryness slight.  Same partner with change. She is now divorced and has a new partner X 2 weeks.  Last menses started 06/15/15. Cycles are about 21- 23 days apart.  She has not needed anything for birth control for several years as she has not been SA.  She would like to go on OCP.  ROS: no weight loss, fever, night sweats and feels well  O General: alert, oriented to person, place, and time, normal mood, behavior, speech, dress, motor activity, and thought processes   healthy,  alert and  not in acute distress  Abdomen: mild suprapubic pressure  No CVA tenderness  Pelvic: cervix normal in appearance, external genitalia normal, no cervical motion tenderness and light menses flow   Diagnostic Test:    Urinalysis:  Unable to determine secondary to AZO   urine C&S and micro, GC & Chl - chooses to have rest of STD's done at [facility]  Assessment: R/O UTI   Contraception needs   R/O STD's  Plan:  Maintain adequate hydration. Follow up if symptoms not improving, and as needed.   Medication Therapy: Macrobid 100 mg BID # 14   [id number] with Urine Culture and micro   Follow with GC & CHL   Will start her on Lo Loestrin per Dr. Edward Jolly for birth control.  Reviewed start date, BUM, and potential side effects.  She is to call if questions or concerns.    RV

## 2015-06-16 NOTE — Patient Instructions (Signed)

## 2015-06-17 ENCOUNTER — Ambulatory Visit: Payer: BLUE CROSS/BLUE SHIELD | Admitting: Certified Nurse Midwife

## 2015-06-17 LAB — URINALYSIS, MICROSCOPIC ONLY
Casts: NONE SEEN [LPF]
Crystals: NONE SEEN [HPF]
Yeast: NONE SEEN [HPF]

## 2015-06-18 LAB — URINE CULTURE

## 2015-06-18 LAB — IPS N GONORRHOEA AND CHLAMYDIA BY PCR

## 2015-06-18 NOTE — Progress Notes (Signed)
Encounter reviewed by Dr. Brook Amundson C. Silva.  

## 2015-06-28 ENCOUNTER — Other Ambulatory Visit: Payer: Self-pay | Admitting: Family Medicine

## 2015-08-27 ENCOUNTER — Other Ambulatory Visit: Payer: Self-pay | Admitting: Family Medicine

## 2015-08-27 MED ORDER — AMPHETAMINE-DEXTROAMPHET ER 30 MG PO CP24: 30.0000 mg | ORAL_CAPSULE | ORAL | Status: DC

## 2015-08-27 MED ORDER — AMPHETAMINE-DEXTROAMPHETAMINE 20 MG PO TABS: ORAL_TABLET | ORAL | Status: DC

## 2015-08-27 NOTE — Telephone Encounter (Signed)
Pt need new rxs generic adderall xr 30 and 20 mg

## 2015-08-27 NOTE — Telephone Encounter (Signed)
done

## 2015-08-28 NOTE — Telephone Encounter (Signed)
Pt called and spoke with Alcario DroughtErica.  Rx is ready for pick up at the front.

## 2015-09-02 ENCOUNTER — Other Ambulatory Visit: Payer: Self-pay | Admitting: Internal Medicine

## 2015-10-19 ENCOUNTER — Telehealth: Payer: Self-pay | Admitting: Family Medicine

## 2015-10-19 NOTE — Telephone Encounter (Signed)
Pt needs new rx generic adderall xr 30 and generic adderall 20 mg °

## 2015-10-20 NOTE — Telephone Encounter (Signed)
I left a voice message with below information. 

## 2015-10-20 NOTE — Telephone Encounter (Signed)
She already has refills to last until 11-27-15

## 2015-10-21 ENCOUNTER — Other Ambulatory Visit: Payer: Self-pay | Admitting: Internal Medicine

## 2015-10-21 MED ORDER — BUDESONIDE 3 MG PO CPEP: 9.0000 mg | ORAL_CAPSULE | Freq: Every day | ORAL | Status: DC

## 2015-10-21 NOTE — Telephone Encounter (Signed)
My chart message was sent to the patient by Dr. Leone Payorgessner

## 2015-11-12 ENCOUNTER — Other Ambulatory Visit: Payer: Self-pay

## 2015-11-12 DIAGNOSIS — Z1231 Encounter for screening mammogram for malignant neoplasm of breast: Secondary | ICD-10-CM

## 2015-11-25 ENCOUNTER — Ambulatory Visit (INDEPENDENT_AMBULATORY_CARE_PROVIDER_SITE_OTHER): Payer: BLUE CROSS/BLUE SHIELD | Admitting: Internal Medicine

## 2015-11-25 ENCOUNTER — Encounter: Payer: Self-pay | Admitting: Internal Medicine

## 2015-11-25 VITALS — BP 126/84 | HR 86 | Ht 64.0 in | Wt 164.0 lb

## 2015-11-25 DIAGNOSIS — K52831 Collagenous colitis: Secondary | ICD-10-CM | POA: Diagnosis not present

## 2015-11-25 MED ORDER — BUDESONIDE 3 MG PO CPEP: 9.0000 mg | ORAL_CAPSULE | Freq: Every day | ORAL | Status: DC

## 2015-11-25 NOTE — Patient Instructions (Signed)
   Try to take the Entocort 3 mg alternating with 6 mg daily.  See if you can come off the Zoloft - discuss with Dr. Clent Ridges.  I appreciate the opportunity to care for you. Iva Boop, MD, [facility]

## 2015-11-25 NOTE — Progress Notes (Signed)
   Subjective:    Patient ID: Latoya Olson, female    DOB: 1965/12/07, 50 y.o.   MRN: [id number] Chief complaint: Follow-up collagenous colitis HPI Patient is here reporting that as long as she stays on 6 mg of Entocort she does well. If she goes down to 3 mg daily she'll start having cramps and diarrhea. She does not seem to be having any side effects of the Entocort.  Medications, allergies, past medical history, past surgical history, family history and social history are reviewed and updated in the EMR.  Review of Systems As above. Recently started some oral contraceptives to try to treat perimenopausal symptoms. She has been on sertraline for that for some time.    Objective:   Physical Exam BP 126/84 mmHg  Pulse 86  Ht  (1.626 m)  Wt 164 lb (74.39 kg)  BMI 28.14 kg/m2  LMP 11/04/2015 No acute distress     Assessment & Plan:   1. Collagenous colitis    She is under control at 6 mg of Entocort daily. The medication is costly. I have refilled that. She has a big deductible to get through with this medication for out of pocket cost. She may consider using meds via [place].  I reviewed potential side effects of long-term Entocort, she is aware of these, these are less common than with prednisone, but it's the best treatment option we have for her right now it seems. I do think it is worthwhile trying to come off of sertraline as SSRI drugs have been implicated as a cause of microscopic and collagenous colitis. That might improve things and allow lower dose or even coming off Entocort. Other options would be something like an immune modulator. See Dr. Daisey Must soon and we'll discuss trying to taper off sertraline. I plan to see her in 12 months sooner if needed.  15 minutes time spent with patient > half in counseling coordination of care

## 2015-11-27 ENCOUNTER — Telehealth: Payer: Self-pay | Admitting: Family Medicine

## 2015-11-27 MED ORDER — AMPHETAMINE-DEXTROAMPHETAMINE 20 MG PO TABS: ORAL_TABLET | ORAL | Status: DC

## 2015-11-27 MED ORDER — AMPHETAMINE-DEXTROAMPHET ER 30 MG PO CP24: 30.0000 mg | ORAL_CAPSULE | ORAL | Status: DC

## 2015-11-27 NOTE — Telephone Encounter (Signed)
done

## 2015-11-27 NOTE — Telephone Encounter (Signed)
Pt request refill of the following: amphetamine-dextroamphetamine (ADDERALL XR) 30 MG 24 hr capsule , amphetamine-dextroamphetamine (ADDERALL) 20 MG tablet   Phamacy:

## 2015-11-30 NOTE — Telephone Encounter (Signed)
Scripts are ready and I spoke with pt.  

## 2015-12-02 ENCOUNTER — Ambulatory Visit
Admission: RE | Admit: 2015-12-02 | Discharge: 2015-12-02 | Disposition: A | Discharge status: Home / Self Care | Payer: BLUE CROSS/BLUE SHIELD | Source: Ambulatory Visit

## 2015-12-02 DIAGNOSIS — Z1231 Encounter for screening mammogram for malignant neoplasm of breast: Secondary | ICD-10-CM

## 2016-02-17 ENCOUNTER — Telehealth: Payer: Self-pay | Admitting: Family Medicine

## 2016-02-17 MED ORDER — AMPHETAMINE-DEXTROAMPHETAMINE 20 MG PO TABS: ORAL_TABLET | ORAL | Status: DC

## 2016-02-17 MED ORDER — AMPHETAMINE-DEXTROAMPHET ER 30 MG PO CP24: 30.0000 mg | ORAL_CAPSULE | ORAL | Status: DC

## 2016-02-17 NOTE — Telephone Encounter (Signed)
done

## 2016-02-17 NOTE — Telephone Encounter (Signed)
Scripts are ready for pick up and I left a voice message for pt. 

## 2016-02-17 NOTE — Telephone Encounter (Signed)
Pt need new Rx for Adderall °

## 2016-05-30 ENCOUNTER — Telehealth: Payer: Self-pay | Admitting: Family Medicine

## 2016-05-30 NOTE — Telephone Encounter (Signed)
°  Pt req refill    amphetamine-dextroamphetamine (ADDERALL XR) 30 MG 24 hr capsule  amphetamine-dextroamphetamine (ADDERALL) 20 MG tablet

## 2016-06-01 MED ORDER — AMPHETAMINE-DEXTROAMPHET ER 30 MG PO CP24: 30.0000 mg | ORAL_CAPSULE | ORAL | 0 refills | Status: DC

## 2016-06-01 MED ORDER — AMPHETAMINE-DEXTROAMPHETAMINE 20 MG PO TABS: ORAL_TABLET | ORAL | 0 refills | Status: DC

## 2016-06-01 NOTE — Telephone Encounter (Signed)
Wrote for a one month supply only. She needs an OV soon

## 2016-06-02 NOTE — Telephone Encounter (Signed)
Pt aware.

## 2016-06-08 ENCOUNTER — Ambulatory Visit (INDEPENDENT_AMBULATORY_CARE_PROVIDER_SITE_OTHER): Payer: BLUE CROSS/BLUE SHIELD | Admitting: Family Medicine

## 2016-06-08 ENCOUNTER — Encounter: Payer: Self-pay | Admitting: Family Medicine

## 2016-06-08 VITALS — BP 138/89 | HR 68 | Temp 98.7°F | Ht 64.0 in | Wt 163.0 lb

## 2016-06-08 DIAGNOSIS — F411 Generalized anxiety disorder: Secondary | ICD-10-CM | POA: Diagnosis not present

## 2016-06-08 DIAGNOSIS — Z23 Encounter for immunization: Secondary | ICD-10-CM

## 2016-06-08 DIAGNOSIS — K52831 Collagenous colitis: Secondary | ICD-10-CM

## 2016-06-08 DIAGNOSIS — F902 Attention-deficit hyperactivity disorder, combined type: Secondary | ICD-10-CM

## 2016-06-08 LAB — CBC WITH DIFFERENTIAL/PLATELET
BASOS ABS: 0 10*3/uL (ref 0.0–0.1)
BASOS PCT: 0.4 % (ref 0.0–3.0)
EOS ABS: 0 10*3/uL (ref 0.0–0.7)
EOS PCT: 0.4 % (ref 0.0–5.0)
HCT: 38.5 % (ref 36.0–46.0)
Hemoglobin: 13.3 g/dL (ref 12.0–15.0)
LYMPHS ABS: 1.2 10*3/uL (ref 0.7–4.0)
Lymphocytes Relative: 15.2 % (ref 12.0–46.0)
MCHC: 34.5 g/dL (ref 30.0–36.0)
MCV: 95.2 fl (ref 78.0–100.0)
MONO ABS: 0.3 10*3/uL (ref 0.1–1.0)
Monocytes Relative: 3.9 % (ref 3.0–12.0)
NEUTROS ABS: 6.4 10*3/uL (ref 1.4–7.7)
NEUTROS PCT: 80.1 % — AB (ref 43.0–77.0)
PLATELETS: 335 10*3/uL (ref 150.0–400.0)
RBC: 4.04 Mil/uL (ref 3.87–5.11)
RDW: 12.2 % (ref 11.5–15.5)
WBC: 8 10*3/uL (ref 4.0–10.5)

## 2016-06-08 LAB — POC URINALSYSI DIPSTICK (AUTOMATED)
Bilirubin, UA: NEGATIVE
GLUCOSE UA: NEGATIVE
KETONES UA: NEGATIVE
Nitrite, UA: NEGATIVE
Spec Grav, UA: 1.015
Urobilinogen, UA: 0.2
pH, UA: 6

## 2016-06-08 LAB — LIPID PANEL
CHOLESTEROL: 240 mg/dL — AB (ref 0–200)
HDL: 70.2 mg/dL (ref >39.00)
LDL Cholesterol: 154 mg/dL — ABNORMAL HIGH (ref 0–99)
NonHDL: 170.16
Total CHOL/HDL Ratio: 3
Triglycerides: 82 mg/dL (ref 0.0–149.0)
VLDL: 16.4 mg/dL (ref 0.0–40.0)

## 2016-06-08 LAB — BASIC METABOLIC PANEL
BUN: 17 mg/dL (ref 6–23)
CO2: 29 meq/L (ref 19–32)
Calcium: 8.8 mg/dL (ref 8.4–10.5)
Chloride: 102 mEq/L (ref 96–112)
Creatinine, Ser: 0.79 mg/dL (ref 0.40–1.20)
GFR: 81.39 mL/min (ref >60.00)
GLUCOSE: 91 mg/dL (ref 70–99)
POTASSIUM: 3.7 meq/L (ref 3.5–5.1)
SODIUM: 138 meq/L (ref 135–145)

## 2016-06-08 LAB — TSH: TSH: 1.03 u[IU]/mL (ref 0.35–4.50)

## 2016-06-08 LAB — HEPATIC FUNCTION PANEL
ALBUMIN: 4.3 g/dL (ref 3.5–5.2)
ALK PHOS: 34 U/L — AB (ref 39–117)
ALT: 28 U/L (ref 0–35)
AST: 27 U/L (ref 0–37)
Bilirubin, Direct: 0.1 mg/dL (ref 0.0–0.3)
TOTAL PROTEIN: 7 g/dL (ref 6.0–8.3)
Total Bilirubin: 0.5 mg/dL (ref 0.2–1.2)

## 2016-06-08 MED ORDER — SERTRALINE HCL 100 MG PO TABS: 100.0000 mg | ORAL_TABLET | Freq: Every day | ORAL | 3 refills | Status: DC

## 2016-06-08 NOTE — Progress Notes (Signed)
   Subjective:    Patient ID: Latoya Olson, female    DOB: 1966/03/11, 50 y.o.   MRN: [id number]  HPI Here for follow up of several issues. She has been feeling well. Her ADHD is well controlled on Adderall. Her anxiety is stable. Appetite and sleep are good. Her GI symptoms are controlled on Budesonide.    Review of Systems  Constitutional: Negative.   Respiratory: Negative.   Cardiovascular: Negative.   Gastrointestinal: Negative.   Neurological: Negative.   Psychiatric/Behavioral: Negative.        Objective:   Physical Exam  Constitutional: She is oriented to person, place, and time. She appears well-developed and well-nourished.  Neck: No thyromegaly present.  Cardiovascular: Normal rate, regular rhythm, normal heart sounds and intact distal pulses.   Pulmonary/Chest: Effort normal and breath sounds normal.  Lymphadenopathy:    She has no cervical adenopathy.  Neurological: She is alert and oriented to person, place, and time.          Assessment & Plan:  Her ADHD and anxiety are stable. Get fasting labs.

## 2016-06-08 NOTE — Progress Notes (Signed)
Pre visit review using our clinic review tool, if applicable. No additional management support is needed unless otherwise documented below in the visit note. 

## 2016-06-28 ENCOUNTER — Telehealth: Payer: Self-pay | Admitting: Family Medicine

## 2016-06-28 NOTE — Telephone Encounter (Signed)
Pt needs new rxs generic adderall xr 30 mg and generic adderall 20 mg °

## 2016-06-29 ENCOUNTER — Other Ambulatory Visit: Payer: Self-pay | Admitting: Family Medicine

## 2016-06-29 ENCOUNTER — Other Ambulatory Visit: Payer: Self-pay | Admitting: Internal Medicine

## 2016-06-30 MED ORDER — AMPHETAMINE-DEXTROAMPHETAMINE 20 MG PO TABS: ORAL_TABLET | ORAL | 0 refills | Status: DC

## 2016-06-30 MED ORDER — AMPHETAMINE-DEXTROAMPHET ER 30 MG PO CP24: 30.0000 mg | ORAL_CAPSULE | ORAL | 0 refills | Status: DC

## 2016-06-30 NOTE — Telephone Encounter (Signed)
done

## 2016-06-30 NOTE — Telephone Encounter (Signed)
Scripts are ready for pick up at front office and I spoke with pt.  

## 2016-07-10 DIAGNOSIS — W109XXA Fall (on) (from) unspecified stairs and steps, initial encounter: Secondary | ICD-10-CM

## 2016-07-10 HISTORY — DX: Fall (on) (from) unspecified stairs and steps, initial encounter: W10.9XXA

## 2016-07-19 ENCOUNTER — Other Ambulatory Visit: Payer: Self-pay | Admitting: Nurse Practitioner

## 2016-07-19 NOTE — Telephone Encounter (Signed)
Must come in for AEX with Dr. Edward JollySilva

## 2016-07-19 NOTE — Telephone Encounter (Signed)
Patient's pharmacy called to check on the status of the refill request. The patient made a trip into the pharmacy to check on the status.

## 2016-07-19 NOTE — Telephone Encounter (Signed)
Medication refill request: LO LOESTRIN FE 1/10 Last AEX:  11/17/14 BS; Office Visit 06/16/15 - PG Next AEX: none scheduled Last MMG (if hormonal medication request): 12/02/15 [id number] negative Refill authorized: 06/16/15 #3packs w/3 refills; today please advise. Patient needs appt

## 2016-07-19 NOTE — Telephone Encounter (Signed)
patient calling to check on the status of script

## 2016-07-20 NOTE — Telephone Encounter (Signed)
Tried calling patient, went straight to voicemail. Left message to call office back. Patient needs to be scheduled for an AEX with Dr. Edward JollySilva

## 2016-07-20 NOTE — Telephone Encounter (Signed)
Patient scheduled for 08/24/16 with Dr. Edward JollySilva

## 2016-07-28 ENCOUNTER — Other Ambulatory Visit: Payer: Self-pay | Admitting: Internal Medicine

## 2016-08-14 ENCOUNTER — Other Ambulatory Visit: Payer: Self-pay | Admitting: Nurse Practitioner

## 2016-08-15 ENCOUNTER — Other Ambulatory Visit: Payer: Self-pay | Admitting: Nurse Practitioner

## 2016-08-15 MED ORDER — NORETHIN-ETH ESTRAD-FE BIPHAS 1 MG-10 MCG / 10 MCG PO TABS: 1.0000 | ORAL_TABLET | Freq: Every day | ORAL | 0 refills | Status: DC

## 2016-08-24 ENCOUNTER — Ambulatory Visit: Payer: Self-pay | Admitting: Obstetrics and Gynecology

## 2016-08-26 NOTE — Progress Notes (Signed)
50 y.o. 22P2002 Divorced Caucasian female here for annual exam.    LoLoEstrin is helping her PMS and cramping.  No real spotting.  No missed pills.  No condom use.  Wants STD testing.   Daughter is having mood swings.   Is a Environmental managerphotographer.  UPT today: Negative   PCP:   Gershon CraneStephen Fry, MD  No LMP recorded. Patient is not currently having periods (Reason: Oral contraceptives). --Loestrin    Period Cycle (Days):  (no cycles with Loestrin)     Sexually active: Yes.   female The current method of family planning is LoLoestrin.    Exercising: No.   Smoker:  no  Health Maintenance: Pap:  11-17-14 Neg:Neg HR HPV History of abnormal Pap:  no MMG:  12-02-15 3D/Density D/Neg/BiRads1:[facility] Colonoscopy: 10/2007 Dr.Gessner--hx colitis  BMD:   2015  Result:normal with Dr.Gessner   TDaP: PCP Gardasil:   N/A HIV:  Today Hep C:  Today Screening Labs:  Hb today: PCP, Urine today: 1+ WBCs, 1+RBCs--asymptomatic.     reports that she has never smoked. She has never used smokeless tobacco. She reports that she drinks about 16.8 oz of alcohol per week . She reports that she does not use drugs.  Past Medical History:  Diagnosis Date  . ADHD (attention deficit hyperactivity disorder)   . Anxiety   . Collagenous colitis 2009   sees Dr. Stan Headarl Gessner   . Dysmenorrhea   . Fall (on) (from) unspecified stairs and steps, initial encounter 07/2016   hurt left calf--badly bruised  . Hemorrhoids   . Premenstrual dysphoric disorder     Past Surgical History:  Procedure Laterality Date  . COLONOSCOPY  10/23/2007   per Dr. Leone PayorGessner, collagenous colitis, no polyps, repeat 10 yrs     Current Outpatient Prescriptions  Medication Sig Dispense Refill  . ACZONE 5 % topical gel Apply 1 application topically as needed.    Marland Kitchen. amphetamine-dextroamphetamine (ADDERALL XR) 30 MG 24 hr capsule Take 1 capsule (30 mg total) by mouth every morning. 30 capsule 0  . amphetamine-dextroamphetamine (ADDERALL) 20 MG  tablet Daily in the evenings 30 tablet 0  . budesonide (ENTOCORT EC) 3 MG 24 hr capsule Take 3 capsules (9 mg total) by mouth daily. And as directed 100 capsule 5  . budesonide (ENTOCORT EC) 3 MG 24 hr capsule TAKE 3 CAPSULES(9 MG) BY MOUTH DAILY AS DIRECTED 90 capsule 0  . diclofenac sodium (VOLTAREN) 1 % GEL Apply 1 application topically as needed.  0  . ibuprofen (ADVIL,MOTRIN) 200 MG tablet Take 200 mg by mouth every 6 (six) hours as needed for pain.    . Norethindrone-Ethinyl Estradiol-Fe Biphas (LO LOESTRIN FE) 1 MG-10 MCG / 10 MCG tablet Take 1 tablet by mouth daily. 28 tablet 0  . sertraline (ZOLOFT) 100 MG tablet TAKE 1 TABLET BY MOUTH EVERY DAY 90 tablet 1   No current facility-administered medications for this visit.     Family History  Problem Relation Age of Onset  . Lymphoma Mother   . Colon cancer Mother     --anal CA--  . Lupus Mother   . Congestive Heart Failure Mother 2165    DEC from CHF  . Cancer Father [id number]    DEC from squamous cell CA  . Hypertension Father   . Coronary artery disease    . Lung cancer      ROS:  Pertinent items are noted in HPI.  Otherwise, a comprehensive ROS was negative.  Exam:  BP 124/80 (BP Location: Right Arm, Patient Position: Sitting, Cuff Size: Normal)   Pulse 70   Resp 18   Ht 5\' 4"  (1.626 m)   Wt 162 lb (73.5 kg)   BMI 27.81 kg/m     General appearance: alert, cooperative and appears stated age Head: Normocephalic, without obvious abnormality, atraumatic Neck: no adenopathy, supple, symmetrical, trachea midline and thyroid normal to inspection and palpation Lungs: clear to auscultation bilaterally Breasts: normal appearance, no masses or tenderness, No nipple retraction or dimpling, No nipple discharge or bleeding, No axillary or supraclavicular adenopathy Heart: regular rate and rhythm Abdomen: soft, non-tender; no masses, no organomegaly Extremities: extremities normal, atraumatic, no cyanosis or edema Skin: Skin color,  texture, turgor normal. No rashes or lesions Lymph nodes: Cervical, supraclavicular, and axillary nodes normal. No abnormal inguinal nodes palpated Neurologic: Grossly normal  Pelvic: External genitalia:  no lesions              Urethra:  normal appearing urethra with no masses, tenderness or lesions              Bartholins and Skenes: normal                 Vagina: normal appearing vagina with normal color and discharge, no lesions              Cervix: no lesions              Pap taken: No. Bimanual Exam:  Uterus:  normal size, contour, position, consistency, mobility, non-tender              Adnexa: no mass, fullness, tenderness              Rectal exam: Yes.  .  Confirms.              Anus:  normal sphincter tone, no lesions  Chaperone was present for exam.  Assessment:   Well woman visit with normal exam. Desire for STD screening.  Hx colitis.  PMS on LoLoEstrin. Amenorrhea on OCPs.  Plan: Yearly mammogram recommended after age 51.  Recommended self breast exam.  Pap and HR HPV as above. Guidelines for Calcium, Vitamin D, regular exercise program including cardiovascular and weight bearing exercise. LoLoEstrin refilled for one year.  However, [facility] to see if has transitioned to menopause. STD screening.  Due for colonoscopy with GI.  She will schedule. Follow up annually and prn.   After visit summary provided.

## 2016-08-29 ENCOUNTER — Other Ambulatory Visit: Payer: Self-pay | Admitting: Internal Medicine

## 2016-09-07 ENCOUNTER — Ambulatory Visit (INDEPENDENT_AMBULATORY_CARE_PROVIDER_SITE_OTHER): Payer: BLUE CROSS/BLUE SHIELD | Admitting: Obstetrics and Gynecology

## 2016-09-07 ENCOUNTER — Encounter: Payer: Self-pay | Admitting: Obstetrics and Gynecology

## 2016-09-07 VITALS — BP 124/80 | HR 70 | Resp 18 | Ht 64.0 in | Wt 162.0 lb

## 2016-09-07 DIAGNOSIS — Z113 Encounter for screening for infections with a predominantly sexual mode of transmission: Secondary | ICD-10-CM

## 2016-09-07 DIAGNOSIS — Z0000 Encounter for general adult medical examination without abnormal findings: Secondary | ICD-10-CM | POA: Diagnosis not present

## 2016-09-07 DIAGNOSIS — Z01419 Encounter for gynecological examination (general) (routine) without abnormal findings: Secondary | ICD-10-CM

## 2016-09-07 DIAGNOSIS — N912 Amenorrhea, unspecified: Secondary | ICD-10-CM

## 2016-09-07 LAB — POCT URINALYSIS DIPSTICK
BILIRUBIN UA: NEGATIVE
GLUCOSE UA: NEGATIVE
KETONES UA: NEGATIVE
Nitrite, UA: NEGATIVE
Protein, UA: NEGATIVE
Urobilinogen, UA: NEGATIVE
pH, UA: 5

## 2016-09-07 LAB — POCT URINE PREGNANCY: PREG TEST UR: NEGATIVE

## 2016-09-07 MED ORDER — NORETHIN-ETH ESTRAD-FE BIPHAS 1 MG-10 MCG / 10 MCG PO TABS: 1.0000 | ORAL_TABLET | Freq: Every day | ORAL | 3 refills | Status: DC

## 2016-09-07 NOTE — Patient Instructions (Signed)

## 2016-09-08 LAB — HEPATITIS C ANTIBODY: HCV AB: NEGATIVE

## 2016-09-08 LAB — GC/CHLAMYDIA PROBE AMP
CT Probe RNA: NOT DETECTED
GC Probe RNA: NOT DETECTED

## 2016-09-08 LAB — STD PANEL
HEP B S AG: NEGATIVE
HIV 1&2 Ab, 4th Generation: NONREACTIVE

## 2016-09-30 ENCOUNTER — Other Ambulatory Visit: Payer: Self-pay | Admitting: Internal Medicine

## 2016-10-05 ENCOUNTER — Telehealth: Payer: Self-pay | Admitting: Family Medicine

## 2016-10-05 NOTE — Telephone Encounter (Signed)
Pt needs new rxs generic adderall xr 30 and 20 mg

## 2016-10-07 MED ORDER — AMPHETAMINE-DEXTROAMPHET ER 30 MG PO CP24: 30.0000 mg | ORAL_CAPSULE | ORAL | 0 refills | Status: DC

## 2016-10-07 MED ORDER — AMPHETAMINE-DEXTROAMPHETAMINE 20 MG PO TABS: ORAL_TABLET | ORAL | 0 refills | Status: DC

## 2016-10-07 NOTE — Telephone Encounter (Signed)
done

## 2016-10-07 NOTE — Telephone Encounter (Signed)
Script is ready for pick up here at front office and I spoke with pt.  

## 2016-11-01 ENCOUNTER — Other Ambulatory Visit: Payer: Self-pay | Admitting: Internal Medicine

## 2016-11-09 ENCOUNTER — Other Ambulatory Visit: Payer: Self-pay | Admitting: Obstetrics and Gynecology

## 2016-11-09 DIAGNOSIS — Z1231 Encounter for screening mammogram for malignant neoplasm of breast: Secondary | ICD-10-CM

## 2016-12-07 ENCOUNTER — Ambulatory Visit
Admission: RE | Admit: 2016-12-07 | Discharge: 2016-12-07 | Disposition: A | Discharge status: Home / Self Care | Payer: BLUE CROSS/BLUE SHIELD | Source: Ambulatory Visit | Attending: Obstetrics and Gynecology | Admitting: Obstetrics and Gynecology

## 2016-12-07 DIAGNOSIS — Z1231 Encounter for screening mammogram for malignant neoplasm of breast: Secondary | ICD-10-CM

## 2017-01-05 ENCOUNTER — Telehealth: Payer: Self-pay | Admitting: Family Medicine

## 2017-01-05 DIAGNOSIS — E782 Mixed hyperlipidemia: Secondary | ICD-10-CM

## 2017-01-05 MED ORDER — AMPHETAMINE-DEXTROAMPHET ER 30 MG PO CP24: 30.0000 mg | ORAL_CAPSULE | ORAL | 0 refills | Status: DC

## 2017-01-05 MED ORDER — AMPHETAMINE-DEXTROAMPHETAMINE 20 MG PO TABS: ORAL_TABLET | ORAL | 0 refills | Status: DC

## 2017-01-05 NOTE — Telephone Encounter (Signed)
Pt needs new rxs.  generic adderall xr 30 and generic adderall 20 mg. Pt also would like to have her chole recheck due to a little elevated in aug 2017. Can I sch?

## 2017-01-05 NOTE — Telephone Encounter (Signed)
Script is ready for pick up here at front office, also pt can schedule the lab appointment, left a voice message with this information.

## 2017-01-05 NOTE — Telephone Encounter (Signed)
done

## 2017-01-05 NOTE — Telephone Encounter (Signed)
I ordered a lipid panel.

## 2017-01-05 NOTE — Telephone Encounter (Signed)
Pt need new Rx for Adderall   Pt is aware of 3 business days for refills and the office will be closed on tomorrow and someone will give her a call when ready.

## 2017-02-02 ENCOUNTER — Other Ambulatory Visit: Payer: Self-pay | Admitting: Internal Medicine

## 2017-02-08 ENCOUNTER — Other Ambulatory Visit: Payer: Self-pay

## 2017-02-08 MED ORDER — BUDESONIDE 3 MG PO CPEP
9.0000 mg | ORAL_CAPSULE | Freq: Every day | ORAL | 5 refills | Status: DC
Start: 2017-02-08 — End: 2017-04-06

## 2017-02-08 MED ORDER — BUDESONIDE 3 MG PO CPEP
9.0000 mg | ORAL_CAPSULE | Freq: Every day | ORAL | 0 refills | Status: DC
Start: 2017-02-08 — End: 2017-03-30

## 2017-03-30 ENCOUNTER — Ambulatory Visit (INDEPENDENT_AMBULATORY_CARE_PROVIDER_SITE_OTHER): Payer: BLUE CROSS/BLUE SHIELD | Admitting: Internal Medicine

## 2017-03-30 ENCOUNTER — Other Ambulatory Visit (INDEPENDENT_AMBULATORY_CARE_PROVIDER_SITE_OTHER): Payer: BLUE CROSS/BLUE SHIELD

## 2017-03-30 ENCOUNTER — Encounter: Payer: Self-pay | Admitting: Internal Medicine

## 2017-03-30 VITALS — BP 120/80 | HR 82 | Ht 64.0 in | Wt 165.8 lb

## 2017-03-30 DIAGNOSIS — K52831 Collagenous colitis: Secondary | ICD-10-CM

## 2017-03-30 DIAGNOSIS — R238 Other skin changes: Secondary | ICD-10-CM

## 2017-03-30 DIAGNOSIS — E7800 Pure hypercholesterolemia, unspecified: Secondary | ICD-10-CM

## 2017-03-30 DIAGNOSIS — R233 Spontaneous ecchymoses: Secondary | ICD-10-CM

## 2017-03-30 LAB — CBC WITH DIFFERENTIAL/PLATELET
Basophils Absolute: 0.1 K/uL (ref 0.0–0.1)
Basophils Relative: 0.9 % (ref 0.0–3.0)
Eosinophils Absolute: 0.2 K/uL (ref 0.0–0.7)
Eosinophils Relative: 2.3 % (ref 0.0–5.0)
HCT: 39.7 % (ref 36.0–46.0)
Hemoglobin: 13.7 g/dL (ref 12.0–15.0)
Lymphocytes Relative: 24.3 % (ref 12.0–46.0)
Lymphs Abs: 1.9 K/uL (ref 0.7–4.0)
MCHC: 34.6 g/dL (ref 30.0–36.0)
MCV: 95.3 fl (ref 78.0–100.0)
Monocytes Absolute: 0.7 K/uL (ref 0.1–1.0)
Monocytes Relative: 9.1 % (ref 3.0–12.0)
Neutro Abs: 4.8 K/uL (ref 1.4–7.7)
Neutrophils Relative %: 63.4 % (ref 43.0–77.0)
Platelets: 267 K/uL (ref 150.0–400.0)
RBC: 4.16 Mil/uL (ref 3.87–5.11)
RDW: 12.2 % (ref 11.5–15.5)
WBC: 7.6 K/uL (ref 4.0–10.5)

## 2017-03-30 LAB — PROTIME-INR
INR: 1 ratio (ref 0.8–1.0)
Prothrombin Time: 11.1 s (ref 9.6–13.1)

## 2017-03-30 LAB — LIPID PANEL
CHOL/HDL RATIO: 3
CHOLESTEROL: 230 mg/dL — AB (ref 0–200)
HDL: 76.1 mg/dL (ref >39.00)
LDL Cholesterol: 137 mg/dL — ABNORMAL HIGH (ref 0–99)
NonHDL: 154.36
TRIGLYCERIDES: 88 mg/dL (ref 0.0–149.0)
VLDL: 17.6 mg/dL (ref 0.0–40.0)

## 2017-03-30 LAB — APTT: aPTT: 25.2 s (ref 23.4–32.7)

## 2017-03-30 NOTE — Progress Notes (Signed)
Latoya Olson 51 y.o. November 26, 1965 [id number]  Assessment & Plan:   Encounter Diagnoses  Name Primary?  . Collagenous colitis Yes  . Easy bruising   . Elevated LDL cholesterol level     Collagenous colitis is in remission right now. Over the years she has not been able to stay off medication for a long period of time and even after a few days off the diarrhea would return. Do think that it's probably related to her bruising. I checked a CBC, a PT and a PTT and they were normal. I'm that I have her stay off the budesonide at this point. We could treat with mesalamine versus observe I favor the latter. Will discuss.  She asked and I went ahead and ordered her lipid panel she had been fasting except for one glass of our juice, and we'll forward those results to Dr. Clent Ridges.  If possible discontinuing sertraline and changing to a different agent for same effect that is not an SSRI could also reduce risk of recurrent problems with collagenous colitis as that is an associated medication as a causative agent.  I appreciate the opportunity to care for this patient. CC: Nelwyn Salisbury, MD   Subjective:   Chief Complaint:Follow-up of colitis and bruising  HPI Latoya Olson is here, with no diarrhea or problems. She actually ran out of her budesonide several days ago and in the past she would've had diarrhea for now but she doesn't. She is concerned because she has easy bruising, her dog scratched her and her skin tore easily and she gets red blotches on her skin. She wonders if it's to budesonide. 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 No Known Allergies Current Meds  Medication Sig  . ACZONE 5 % topical gel Apply 1 application topically as needed.  Marland Kitchen amphetamine-dextroamphetamine (ADDERALL XR) 30 MG 24 hr capsule Take 1 capsule (30 mg  total) by mouth every morning.  Marland Kitchen amphetamine-dextroamphetamine (ADDERALL) 20 MG tablet Daily in the evenings  . budesonide (ENTOCORT EC) 3 MG 24 hr capsule Take 3 capsules (9 mg total) by mouth daily. And as directed  . diclofenac sodium (VOLTAREN) 1 % GEL Apply 1 application topically as needed.  Marland Kitchen ibuprofen (ADVIL,MOTRIN) 200 MG tablet Take 200 mg by mouth every 6 (six) hours as needed for pain.  . Norethindrone-Ethinyl Estradiol-Fe Biphas (LO LOESTRIN FE) 1 MG-10 MCG / 10 MCG tablet Take 1 tablet by mouth daily.  . sertraline (ZOLOFT) 100 MG tablet TAKE 1 TABLET BY MOUTH EVERY DAY  . [DISCONTINUED] budesonide (ENTOCORT EC) 3 MG 24 hr capsule Take 3 capsules (9 mg total) by mouth daily. Take as directed   Past Medical History:  Diagnosis Date  . ADHD (attention deficit hyperactivity disorder)   . Anxiety   . Collagenous colitis 2009   sees Dr. Stan Head   . Dysmenorrhea   . Fall (on) (from) unspecified stairs and steps, initial encounter 07/2016   hurt left calf--badly bruised  . Hemorrhoids   . Premenstrual dysphoric disorder    Past Surgical History:  Procedure Laterality Date  . COLONOSCOPY  10/23/2007   per Dr. Leone Payor, collagenous colitis, no polyps, repeat 10 yrs   Review of Systems As above  Objective:   Physical Exam BP 120/80   Pulse 82   Ht 5\' 4"  (  1.626 m)   Wt 165 lb 12.8 oz (75.2 kg)   BMI 28.46 kg/m  Mildly anxious and tearful me talk about things. Mother had leukemia and mentioning of possible blood abnormalities triggered this.  She has a half-dollar size bruise on the left arm. There are purpuric lesions scattered about on the shins and some of the forearms.  25 minutes time spent with patient > half in counseling coordination of care

## 2017-03-30 NOTE — Patient Instructions (Signed)
  Your physician has requested that you go to the basement for the following lab work before leaving today: CBC/diff, PT, PTT, lipid panel   We will be in touch with results and plans.    I appreciate the opportunity to care for you. Stan Headarl Gessner, MD, [facility]

## 2017-03-30 NOTE — Progress Notes (Signed)
Labs fine - nothing to lead to brusing Likely budesonide'will stop that Will send lipids to Dr. Clent RidgesFry to review My Chart message about the results Will consider taking mesalamine for the collagenous colitis vs observing - if she could come off sertraline (implicated as a possible cause of collagenous colitis) and change to a non-SSRI medication that could possibly prevent recurrence of colitis sxs

## 2017-04-03 ENCOUNTER — Telehealth: Payer: Self-pay | Admitting: Family Medicine

## 2017-04-03 ENCOUNTER — Encounter: Payer: Self-pay | Admitting: Internal Medicine

## 2017-04-03 ENCOUNTER — Other Ambulatory Visit: Payer: Self-pay | Admitting: Family Medicine

## 2017-04-03 NOTE — Telephone Encounter (Signed)
Ok to refill x 1 eachi n Dr Carver FilaFrys absence  Further  Refills from dr Clent RidgesFry .

## 2017-04-03 NOTE — Telephone Encounter (Signed)
Refill request for Adderall XR 30 mg and Adderall 20 mg, please call pt when ready for pick up.

## 2017-04-03 NOTE — Telephone Encounter (Signed)
This is a duplicate note, please see previous one. 

## 2017-04-04 ENCOUNTER — Telehealth: Payer: Self-pay | Admitting: Internal Medicine

## 2017-04-04 DIAGNOSIS — G8929 Other chronic pain: Secondary | ICD-10-CM

## 2017-04-04 DIAGNOSIS — M549 Dorsalgia, unspecified: Principal | ICD-10-CM

## 2017-04-04 MED ORDER — AMPHETAMINE-DEXTROAMPHETAMINE 20 MG PO TABS: ORAL_TABLET | ORAL | 0 refills | Status: DC

## 2017-04-04 MED ORDER — AMPHETAMINE-DEXTROAMPHET ER 30 MG PO CP24: 30.0000 mg | ORAL_CAPSULE | ORAL | 0 refills | Status: DC

## 2017-04-04 NOTE — Telephone Encounter (Signed)
I guess diarrhea is back  Did she see my chart message about Lialda vs Entocort?  We can refill Entocort but she can et compounded version for $100 in [place]  Let me know  I can call her and discuss tomorrow

## 2017-04-04 NOTE — Telephone Encounter (Signed)
Please advise Sir, see your last office note.  Thank you.

## 2017-04-04 NOTE — Telephone Encounter (Signed)
Both scripts are ready for pick up here at front office and left a voice message for pt.

## 2017-04-04 NOTE — Telephone Encounter (Signed)
I spoke with Latoya Olson and she will continue her Imodium tonight and she would like for you to call her tomorrow and discuss the medicine options Sir, thank you.

## 2017-04-06 ENCOUNTER — Other Ambulatory Visit: Payer: Self-pay | Admitting: Internal Medicine

## 2017-04-06 DIAGNOSIS — K52831 Collagenous colitis: Secondary | ICD-10-CM

## 2017-04-06 MED ORDER — BUDESONIDE 3 MG PO CPEP: 9.0000 mg | ORAL_CAPSULE | Freq: Every day | ORAL | 3 refills | Status: DC

## 2017-04-06 MED ORDER — DICLOFENAC SODIUM 1 % TD GEL: 4.0000 g | Freq: Four times a day (QID) | TRANSDERMAL | 3 refills | Status: DC | PRN

## 2017-04-06 MED ORDER — MESALAMINE 1.2 G PO TBEC: 2.4000 g | DELAYED_RELEASE_TABLET | Freq: Two times a day (BID) | ORAL | 5 refills | Status: DC

## 2017-04-06 NOTE — Telephone Encounter (Signed)
Low I think

## 2017-04-06 NOTE — Telephone Encounter (Signed)
Sir where is her back pain, thank you.

## 2017-04-06 NOTE — Telephone Encounter (Signed)
Spoke to her  Plan is 1) Entocort 9 mg daily x 2 mos 2) start Lialda 2.4 g bid 3) at 2 mos try to wean of entocort 4) Voltaren gel over oral NSAID, voltaren gel Rxed 5) Sports med referral back pain - Dr. Terrilee FilesZach Smith 6) See me in about 3 mos

## 2017-04-10 NOTE — Telephone Encounter (Signed)
I've sent her a MyChart message and left her a voice mail message on her cell# to let me know where her back pain is.

## 2017-04-10 NOTE — Telephone Encounter (Signed)
Referral placed and patient informed via MyChart that she will be contacted by Dr Claiborne RiggZachary Smith's office about an appointment.

## 2017-04-21 ENCOUNTER — Telehealth: Payer: Self-pay | Admitting: Family Medicine

## 2017-04-21 NOTE — Telephone Encounter (Signed)
Pt need new Rx for Adderall  Pt is aware of 3 business days for refills and someone will call when ready for pick up.  Pt is concerned about not getting the 3 scripts that she normally gets from Dr. Clent RidgesFry and was wondering if she could get the two remaining ones due she will be going out of town.  Pt would like to have a call to discuss.

## 2017-04-24 MED ORDER — AMPHETAMINE-DEXTROAMPHETAMINE 20 MG PO TABS: ORAL_TABLET | ORAL | 0 refills | Status: DC

## 2017-04-24 MED ORDER — AMPHETAMINE-DEXTROAMPHET ER 30 MG PO CP24: 30.0000 mg | ORAL_CAPSULE | ORAL | 0 refills | Status: DC

## 2017-04-24 NOTE — Telephone Encounter (Signed)
Done. Please explain to there that the other refills were done when I was out of the office

## 2017-04-24 NOTE — Telephone Encounter (Signed)
Script is ready for pick up here at front office and left a voice message with below information.

## 2017-04-26 ENCOUNTER — Other Ambulatory Visit: Payer: Self-pay | Admitting: Internal Medicine

## 2017-04-26 DIAGNOSIS — M549 Dorsalgia, unspecified: Principal | ICD-10-CM

## 2017-04-26 DIAGNOSIS — G8929 Other chronic pain: Secondary | ICD-10-CM

## 2017-05-11 ENCOUNTER — Telehealth: Payer: Self-pay

## 2017-05-11 NOTE — Telephone Encounter (Signed)
Left patient a detailed message that I noticed she had not checked her MyChart message about her upcoming appointment with Mont DuttonZackary Smith D.O. 05/16/17 at 10:30AM.  I wanted to reach out to her so she doesn't miss this appointment.

## 2017-05-15 NOTE — Progress Notes (Signed)
[facility] [address] [place], [place] [address] Phone: [phone] Subjective:    I'm seeing this patient by the request  of:  Nelwyn Salisbury, MD   CC: Back pain  OZH:[id number]  Latoya Olson is a 51 y.o. female coming in with complaint of back pain. 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 She also has pain in her traps. She denies any pain going down her legs.  Onset- years, chronic Location- lumbar, cervical Duration- consant Character- dull, achy Aggravating factors- carrying equipment, later in the day Reliving factors- massage, motrin Therapies tried- massage, motrin Severity- 4/10   patient's x-rays were last done in 2013 and were unremarkable. These were independently visualized by me.  Past Medical History:  Diagnosis Date  . ADHD (attention deficit hyperactivity disorder)   . Anxiety   . Collagenous colitis 2009   sees Dr. Stan Head   . Dysmenorrhea   . Fall (on) (from) unspecified stairs and steps, initial encounter 07/2016   hurt left calf--badly bruised  . Hemorrhoids   . Premenstrual dysphoric disorder    Past Surgical History:  Procedure Laterality Date  . COLONOSCOPY  10/23/2007   per Dr. Leone Payor, collagenous colitis, no polyps, repeat 10 yrs    Social History   Social History  . Marital status: Divorced    Spouse name: N/A  . Number of children: N/A  . Years of education: N/A   Social History Main Topics  . Smoking status: Never Smoker  . Smokeless tobacco: Never Used  . Alcohol use 16.8 oz/week    14 Standard drinks or equivalent, 14 Glasses of wine per week     Comment: 2glass of wine each day  . Drug use: No  . Sexual activity: Not Currently    Partners: Male    Birth control/ protection: Abstinence   Other Topics Concern  . None   Social History Narrative   Married - separating (2014)   2 daughters   No Known Allergies Family  History  Problem Relation Age of Onset  . Lymphoma Mother   . Colon cancer Mother        --anal CA--  . Lupus Mother   . Congestive Heart Failure Mother 29       DEC from CHF  . Cancer Father 71       DEC from squamous cell CA  . Hypertension Father   . Coronary artery disease Unknown   . Lung cancer Unknown      Past medical history, social, surgical and family history all reviewed in electronic medical record.  No pertanent information unless stated regarding to the chief complaint.   Review of Systems:Review of systems updated and as accurate as of 05/16/17  No , visual changes, nausea, vomiting, diarrhea, constipation, dizziness, abdominal pain, skin rash, fevers, chills, night sweats, weight loss, swollen lymph nodes, body aches, joint swelling, muscle aches, chest pain, shortness of breath, mood changes. Positive headache  Objective  Blood pressure 110/82, pulse 78, height 5\' 5"  (1.651 m), weight 167 lb (75.8 kg). Systems examined below as of 05/16/17   General: No apparent distress alert and oriented x3 mood and affect normal, dressed appropriately.  HEENT: Pupils equal, extraocular movements intact  Respiratory: Patient's speak in full sentences and does not appear short of breath  Cardiovascular: No lower extremity edema, non tender, no erythema  Skin: Warm dry intact with no  signs of infection or rash on extremities or on axial skeleton.  Abdomen: Soft nontender  Neuro: Cranial nerves II through XII are intact, neurovascularly intact in all extremities with 2+ DTRs and 2+ pulses.  Lymph: No lymphadenopathy of posterior or anterior cervical chain or axillae bilaterally.  Gait normal with good balance and coordination.  MSK:  Non tender with full range of motion and good stability and symmetric strength and tone of shoulders, elbows, wrist, hip, knee and ankles bilaterally.  Back Exam:  Inspection: Unremarkable . Mild kyphosis noted Motion: Flexion 45 deg, Extension 25  deg, Side Bending to 45 deg bilaterally,  Rotation to 45 deg bilaterally  SLR laying: Negative  XSLR laying: Negative  Palpable tenderness: Tender to palpation in the thoracolumbar juncture. Pain over the scapular function as well.. Patient will right sacroiliac joint FABER: Positive right. Sensory change: Gross sensation intact to all lumbar and sacral dermatomes.  Reflexes: 2+ at both patellar tendons, 2+ at achilles tendons, Babinski's downgoing.  Strength at foot  Plantar-flexion: 5/5 Dorsi-flexion: 5/5 Eversion: 5/5 Inversion: 5/5  Leg strength  Quad: 5/5 Hamstring: 5/5 Hip flexor: 5/5 Hip abductors: 5/5  Gait unremarkable.  Osteopathic findings C2 flexed rotated and side bent right C4 flexed rotated and side bent left C7 flexed rotated and side bent left T3 extended rotated and side bent right inhaled third rib T9 extended rotated and side bent left L2 flexed rotated and side bent right Sacrum right on right  Procedure note 97110; 15 additional minutes spent for Therapeutic exercises as stated in above notes.  This included exercises focusing on stretching, strengthening, with significant focus on eccentric aspects.   Long term goals include an improvement in range of motion, strength, endurance as well as avoiding reinjury. Patient's frequency would include in 1-2 times a day, 3-5 times a week for a duration of 6-12 weeks.Low back exercises that included:  Pelvic tilt/bracing instruction to focus on control of the pelvic girdle and lower abdominal muscles  Glute strengthening exercises, focusing on proper firing of the glutes without engaging the low back muscles Proper stretching techniques for maximum relief for the hamstrings, hip flexors, low back and some rotation where tolerated   Proper technique shown and discussed handout in great detail with ATC.  All questions were discussed and answered.     Impression and Recommendations:     This case required medical decision  making of moderate complexity.      Note: This dictation was prepared with Dragon dictation along with smaller phrase technology. Any transcriptional errors that result from this process are unintentional.

## 2017-05-16 ENCOUNTER — Encounter: Payer: Self-pay | Admitting: Family Medicine

## 2017-05-16 ENCOUNTER — Ambulatory Visit (INDEPENDENT_AMBULATORY_CARE_PROVIDER_SITE_OTHER): Payer: BLUE CROSS/BLUE SHIELD | Admitting: Family Medicine

## 2017-05-16 DIAGNOSIS — M545 Low back pain, unspecified: Secondary | ICD-10-CM | POA: Insufficient documentation

## 2017-05-16 DIAGNOSIS — M999 Biomechanical lesion, unspecified: Secondary | ICD-10-CM | POA: Insufficient documentation

## 2017-05-16 DIAGNOSIS — G8929 Other chronic pain: Secondary | ICD-10-CM

## 2017-05-16 MED ORDER — DICLOFENAC SODIUM 2 % TD SOLN: 2.0000 "application " | Freq: Two times a day (BID) | TRANSDERMAL | 3 refills | Status: DC

## 2017-05-16 MED ORDER — VITAMIN D (ERGOCALCIFEROL) 1.25 MG (50000 UNIT) PO CAPS: 50000.0000 [IU] | ORAL_CAPSULE | ORAL | 0 refills | Status: DC

## 2017-05-16 NOTE — Assessment & Plan Note (Signed)
Patient does have chronic low back pain. I do think that this is multifactorial with patient having a poor core strength as well as some sacroiliac disease. Patient given home exercises today and, responded well to osteopathic manipulation, we discussed which activities doing which ones to avoid. Topical anti-inflammatory's prescribed. Once weekly vitamin D for strength. Patient will come back and see me again in 3-4 weeks.

## 2017-05-16 NOTE — Assessment & Plan Note (Signed)
Decision today to treat with OMT was based on Physical Exam  After verbal consent patient was treated with HVLA, ME, FPR techniques in cervical, thoracic, lumbar and sacral areas  Patient tolerated the procedure well with improvement in symptoms  Patient given exercises, stretches and lifestyle modifications  See medications in patient instructions if given  Patient will follow up in 4 weeks 

## 2017-05-16 NOTE — Patient Instructions (Signed)
Good to see you.  Ice 20 minutes 2 times daily. Usually after activity and before bed. Exercises 3 times a week. Alternate the upper back and the lower back  OK to do yoga 1-2 times a week  pennsaid pinkie amount topically 2 times daily as needed.   Once weekly vitamin D for 12 weeks Stop the ibuprofen  Turmeric 500mg  twice daily  Tart cherry extract any dose at night See me again in 3-4 weeks

## 2017-06-08 NOTE — Progress Notes (Signed)
[facility] Latoya Olson D.O. [facility] [address] [place], [place] [address] Phone: [phone] Subjective:    I'm seeing this patient by the request  of:  Nelwyn SalisburyFry, Stephen A, MD   CC: Back pain f/u  OZH:[id number]:Subjective  Latoya Olson Latoya Olson is a 51 y.o. female coming in with complaint of back pain. Patient was found to have more sacroiliac dysfunction and tight hamstrings. Patient will start with manipulation and felt like she is making progress. Has been only doing the exercises intermittently. Feels though that as long as she doesn't she seems to be getting better. Not taking the vitamins regularly. He is using the topical medicines which seems to be beneficial.   patient's x-rays were last done in 2013 and were unremarkable. These were independently visualized by me.  Past Medical History:  Diagnosis Date  . ADHD (attention deficit hyperactivity disorder)   . Anxiety   . Collagenous colitis 2009   sees Dr. Stan Headarl Gessner   . Dysmenorrhea   . Fall (on) (from) unspecified stairs and steps, initial encounter 07/2016   hurt left calf--badly bruised  . Hemorrhoids   . Premenstrual dysphoric disorder    Past Surgical History:  Procedure Laterality Date  . COLONOSCOPY  10/23/2007   per Dr. Leone PayorGessner, collagenous colitis, no polyps, repeat 10 yrs    Social History   Social History  . Marital status: Divorced    Spouse name: N/A  . Number of children: N/A  . Years of education: N/A   Social History Main Topics  . Smoking status: Never Smoker  . Smokeless tobacco: Never Used  . Alcohol use 16.8 oz/week    14 Standard drinks or equivalent, 14 Glasses of wine per week     Comment: 2glass of wine each day  . Drug use: No  . Sexual activity: Not Currently    Partners: Male    Birth control/ protection: Abstinence   Other Topics Concern  . Not on file   Social History Narrative   Married - separating (2014)   2 daughters   No Known Allergies Family History  Problem Relation Age  of Onset  . Lymphoma Mother   . Colon cancer Mother        --anal CA--  . Lupus Mother   . Congestive Heart Failure Mother [id number]       DEC from CHF  . Cancer Father [id number]       DEC from squamous cell CA  . Hypertension Father   . Coronary artery disease Unknown   . Lung cancer Unknown      Past medical history, social, surgical and family history all reviewed in electronic medical record.  No pertanent information unless stated regarding to the chief complaint.   Review of Systems: No visual changes, nausea, vomiting, diarrhea, constipation, dizziness, abdominal pain, skin rash, fevers, chills, night sweats, weight loss, swollen lymph nodes, body aches, joint swelling, muscle aches, chest pain, shortness of breath, mood changes.  Positive for headaches  Objective  There were no vitals taken for this visit. Systems examined below as of 06/09/17   General: No apparent distress alert and oriented x3 mood and affect normal, dressed appropriately.  HEENT: Pupils equal, extraocular movements intact  Respiratory: Patient's speak in full sentences and does not appear short of breath  Cardiovascular: No lower extremity edema, non tender, no erythema  Skin: Warm dry intact with no signs of infection or rash on extremities or on axial skeleton.  Abdomen: Soft nontender  Neuro: Cranial nerves II through XII are intact, neurovascularly intact in all extremities with 2+ DTRs and 2+ pulses.  Lymph: No lymphadenopathy of posterior or anterior cervical chain or axillae bilaterally.  Gait normal with good balance and coordination.  MSK:  Non tender with full range of motion and good stability and symmetric strength and tone of shoulders, elbows, wrist, hip, knee and ankles bilaterally.  Back Exam:  Inspection: Unremarkable  Motion: Flexion 40 deg, Extension 25 deg, Side Bending to 25 deg bilaterally,  Rotation to 25 deg bilaterally  SLR laying: Negative  XSLR laying: Negative  Palpable tenderness:  Tender to palpation in the paraspinal musculature of the lumbar spine. Positive on the left sacral iliac joint.Marland Kitchen FABER: Mild positive left. Sensory change: Gross sensation intact to all lumbar and sacral dermatomes.  Reflexes: 2+ at both patellar tendons, 2+ at achilles tendons, Babinski's downgoing.  Strength at foot  Plantar-flexion: 5/5 Dorsi-flexion: 5/5 Eversion: 5/5 Inversion: 5/5  Leg strength  Quad: 5/5 Hamstring: 5/5 Hip flexor: 5/5 Hip abductors: 4/5 but symmetric Gait unremarkable.    Impression and Recommendations:     This case required medical decision making of moderate complexity.      Note: This dictation was prepared with Dragon dictation along with smaller phrase technology. Any transcriptional errors that result from this process are unintentional.

## 2017-06-09 ENCOUNTER — Encounter: Payer: Self-pay | Admitting: Family Medicine

## 2017-06-09 ENCOUNTER — Ambulatory Visit (INDEPENDENT_AMBULATORY_CARE_PROVIDER_SITE_OTHER): Payer: BLUE CROSS/BLUE SHIELD | Admitting: Family Medicine

## 2017-06-09 DIAGNOSIS — M545 Low back pain, unspecified: Secondary | ICD-10-CM

## 2017-06-09 DIAGNOSIS — G8929 Other chronic pain: Secondary | ICD-10-CM | POA: Diagnosis not present

## 2017-06-09 DIAGNOSIS — M999 Biomechanical lesion, unspecified: Secondary | ICD-10-CM | POA: Diagnosis not present

## 2017-06-09 NOTE — Assessment & Plan Note (Signed)
Decision today to treat with OMT was based on Physical Exam  After verbal consent patient was treated with HVLA, ME, FPR techniques in cervical, thoracic, lumbar and sacral areas  Patient tolerated the procedure well with improvement in symptoms  Patient given exercises, stretches and lifestyle modifications  See medications in patient instructions if given  Patient will follow up in 4-6 weeks 

## 2017-06-09 NOTE — Assessment & Plan Note (Signed)
Chronic low back pain. Seems to be more mechanical and multifactorial. We discussed icing regimen, continue the once weekly vitamin D, patient does respond well to osteopathic manipulation. No change in management and follow-up again in 4-6 weeks

## 2017-06-09 NOTE — Patient Instructions (Signed)
Good to see you  Overall great improvement.  Ice is your friend Continue the vitamins and a little regular On wall with heels, butt shoulder and head touching for a goal of 5 minutes daily  See me again in 4 weeks.

## 2017-06-22 ENCOUNTER — Other Ambulatory Visit: Payer: Self-pay | Admitting: Family Medicine

## 2017-06-29 ENCOUNTER — Encounter: Payer: Self-pay | Admitting: Family Medicine

## 2017-07-06 ENCOUNTER — Telehealth: Payer: Self-pay | Admitting: Family Medicine

## 2017-07-06 MED ORDER — AMPHETAMINE-DEXTROAMPHETAMINE 20 MG PO TABS: ORAL_TABLET | ORAL | 0 refills | Status: DC

## 2017-07-06 MED ORDER — AMPHETAMINE-DEXTROAMPHET ER 30 MG PO CP24: 30.0000 mg | ORAL_CAPSULE | ORAL | 0 refills | Status: DC

## 2017-07-06 NOTE — Telephone Encounter (Signed)
Pt need new Rx for Adderall 30 & 20 MG  Pt is aware of 3 business days for refills and someone will call when ready for pick up.

## 2017-07-06 NOTE — Telephone Encounter (Signed)
I wrote for one month of both of these. She needs an OV soon

## 2017-07-07 NOTE — Telephone Encounter (Signed)
Pt is aware rx ready for pick up and she is also aware needs OV soon

## 2017-07-25 ENCOUNTER — Ambulatory Visit (INDEPENDENT_AMBULATORY_CARE_PROVIDER_SITE_OTHER): Payer: BLUE CROSS/BLUE SHIELD | Admitting: Family Medicine

## 2017-07-25 ENCOUNTER — Encounter: Payer: Self-pay | Admitting: Family Medicine

## 2017-07-25 VITALS — BP 120/82 | HR 88 | Temp 98.5°F | Ht 65.0 in | Wt 171.2 lb

## 2017-07-25 DIAGNOSIS — R109 Unspecified abdominal pain: Secondary | ICD-10-CM

## 2017-07-25 DIAGNOSIS — M546 Pain in thoracic spine: Secondary | ICD-10-CM | POA: Diagnosis not present

## 2017-07-25 LAB — POC URINALSYSI DIPSTICK (AUTOMATED)
Bilirubin, UA: NEGATIVE
Clarity, UA: NEGATIVE
GLUCOSE UA: NEGATIVE
Leukocytes, UA: NEGATIVE
Nitrite, UA: NEGATIVE
RBC UA: NEGATIVE
SPEC GRAV UA: 1.02 (ref 1.010–1.025)
UROBILINOGEN UA: 0.2 U/dL
pH, UA: 6 (ref 5.0–8.0)

## 2017-07-25 MED ORDER — CYCLOBENZAPRINE HCL 5 MG PO TABS: 5.0000 mg | ORAL_TABLET | Freq: Three times a day (TID) | ORAL | 1 refills | Status: DC | PRN

## 2017-07-25 NOTE — Patient Instructions (Signed)
BEFORE YOU LEAVE: -udip with reflex -follow up: 1 week  Heat twice daily  Flexeril before bed and as needed per instructions  Gentle stretching  Topical menthol (tiger balm can help with pain)  Use the anti-inflammatory muscle only if needed and cautiously  I hope you are feeling better soon! Seek care sooner if worsening, new concerns or you are not improving with treatment.

## 2017-07-25 NOTE — Progress Notes (Signed)
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, then she nerve furniture yesterday -After moving the furniture she developed gradual onset of pain in the right mid back that radiates around to the side -The pain is constant, dull and somewhat burning in nature -Certain movements worsen the pain, naproxen helped the pain significantly -She has a long history of musculoskeletal issues and chronic back pain, she see sports medicine for treatment -Denies fevers, malaise, change in bowels, nausea, vomiting, acid reflux, dysuria, hematuria, melena, hematochezia -She sees the GI for colitis, usually has changes in her bowels when she has problems with this  ROS: See pertinent positives and negatives per HPI.  Past Medical History:  Diagnosis Date  . ADHD (attention deficit hyperactivity disorder)   . Anxiety   . Collagenous colitis 2009   sees Dr. Stan Head   . Dysmenorrhea   . Fall (on) (from) unspecified stairs and steps, initial encounter 07/2016   hurt left calf--badly bruised  . Hemorrhoids   . Premenstrual dysphoric disorder     Past Surgical History:  Procedure Laterality Date  . COLONOSCOPY  10/23/2007   per Dr. Leone Payor, collagenous colitis, no polyps, repeat 10 yrs     Family History  Problem Relation Age of Onset  . Lymphoma Mother   . Colon cancer Mother        --anal CA--  . Lupus Mother   . Congestive Heart Failure Mother 29       DEC from CHF  . Cancer Father 27       DEC from squamous cell CA  . Hypertension Father   . Coronary artery disease Unknown   . Lung cancer Unknown     Social History   Social History  . Marital status: Divorced    Spouse name: N/A  . Number of children: N/A  . Years of education: N/A   Social History Main Topics  . Smoking status: Never Smoker  . Smokeless tobacco: Never Used  . Alcohol use 16.8 oz/week    14 Standard drinks or equivalent, 14 Glasses of wine  per week     Comment: 2glass of wine each day  . Drug use: No  . Sexual activity: Not Currently    Partners: Male    Birth control/ protection: Abstinence   Other Topics Concern  . None   Social History Narrative   Married - separating (2014)   2 daughters     Current Outpatient Prescriptions:  .  ACZONE 5 % topical gel, Apply 1 application topically as needed., Disp: , Rfl:  .  amphetamine-dextroamphetamine (ADDERALL XR) 30 MG 24 hr capsule, Take 1 capsule (30 mg total) by mouth every morning., Disp: 30 capsule, Rfl: 0 .  amphetamine-dextroamphetamine (ADDERALL) 20 MG tablet, Daily in the evenings, Disp: 30 tablet, Rfl: 0 .  budesonide (ENTOCORT EC) 3 MG 24 hr capsule, Take 3 capsules (9 mg total) by mouth daily. And as directed, Disp: 90 capsule, Rfl: 3 .  Diclofenac Sodium (PENNSAID) 2 % SOLN, Place 2 application onto the skin 2 (two) times daily., Disp: 112 g, Rfl: 3 .  ibuprofen (ADVIL,MOTRIN) 200 MG tablet, Take 200 mg by mouth every 6 (six) hours as needed for pain., Disp: , Rfl:  .  mesalamine (LIALDA) 1.2 g EC tablet, Take 2 tablets (2.4 g total) by mouth 2 (two) times daily with a meal., Disp: 120 tablet, Rfl: 5 .  Norethindrone-Ethinyl Estradiol-Fe Biphas (LO LOESTRIN FE) 1 MG-10 MCG / 10 MCG tablet, Take 1 tablet by mouth daily., Disp: 3 Package, Rfl: 3 .  sertraline (ZOLOFT) 100 MG tablet, TAKE 1 TABLET BY MOUTH EVERY DAY, Disp: 90 tablet, Rfl: 1 .  sertraline (ZOLOFT) 100 MG tablet, TAKE 1 TABLET(100 MG) BY MOUTH DAILY, Disp: 90 tablet, Rfl: 0 .  Vitamin D, Ergocalciferol, (DRISDOL) 50000 units CAPS capsule, Take 1 capsule (50,000 Units total) by mouth every 7 (seven) days., Disp: 12 capsule, Rfl: 0 .  cyclobenzaprine (FLEXERIL) 5 MG tablet, Take 1 tablet (5 mg total) by mouth 3 (three) times daily as needed for muscle spasms., Disp: 30 tablet, Rfl: 1  EXAM:  Vitals:   07/25/17 1136  BP: 120/82  Pulse: 88  Temp: 98.5 F (36.9 C)    Body mass index is 28.49  kg/m.  GENERAL: vitals reviewed and listed above, alert, oriented, appears well hydrated and in no acute distress  HEENT: atraumatic, conjunttiva clear, no obvious abnormalities on inspection of external nose and ears  NECK: no obvious masses on inspection  LUNGS: clear to auscultation bilaterally, no wheezes, rales or rhonchi, good air movement  CV: HRRR, no peripheral edema  ABD: bowel sounds positive all 4 quadrants, soft, nontender to palpation, no rebound or guarding  MS: moves all extremities without noticeable abnormality, there is no rash in the area of concern on her back or flank or abdomen, she has tenderness to palpation in the thoracic paraspinal muscles and around the latissimus dorsi muscle on the right, no bony tenderness to palpation  PSYCH: pleasant and cooperative, no obvious depression or anxiety  ASSESSMENT AND PLAN:  Discussed the following assessment and plan:  Acute right-sided thoracic back pain  Flank pain - Plan: Urinalysis with Reflex Microscopic  -we discussed possible serious and likely etiologies, workup and treatment, treatment risks and return precautions - suspect a musculoskeletal etiology either a muscle or soft tissue strain versus pinched nerve -urine dip was without blood -after this discussion, Ali opted for conservative care with heat, muscle relaxer, stretching, topical treatments and conservative use of anti-inflammatories given her other medications only as needed -follow up advised in 1-2 weeks -of course, we advised Deseree  to return or notify a doctor immediately if symptoms worsen or persist or new concerns arise.   Patient Instructions  BEFORE YOU LEAVE: -udip with reflex -follow up: 1 week  Heat twice daily  Flexeril before bed and as needed per instructions  Gentle stretching  Topical menthol (tiger balm can help with pain)  Use the anti-inflammatory muscle only if needed and cautiously  I hope you are feeling  better soon! Seek care sooner if worsening, new concerns or you are not improving with treatment.      Kriste Basque R., DO

## 2017-07-26 ENCOUNTER — Telehealth: Payer: Self-pay | Admitting: Internal Medicine

## 2017-07-27 NOTE — Telephone Encounter (Signed)
Left message for patient to call back  

## 2017-07-28 NOTE — Telephone Encounter (Signed)
Patient had pain earlier in the week and was given muscle relaxer by her primary that seems to have helped.  It was a burning pain around her "liver".  She declines an appt with APP, wants to wait and see Dr. Leone PayorGessner.  She is scheduled 09/21/17.  She understands to call back if the pain returns to see APP prior to Dec appt.

## 2017-08-08 ENCOUNTER — Ambulatory Visit (INDEPENDENT_AMBULATORY_CARE_PROVIDER_SITE_OTHER): Payer: BLUE CROSS/BLUE SHIELD | Admitting: Family Medicine

## 2017-08-08 ENCOUNTER — Encounter: Payer: Self-pay | Admitting: Family Medicine

## 2017-08-08 VITALS — BP 116/80 | HR 65 | Temp 98.4°F | Wt 174.0 lb

## 2017-08-08 DIAGNOSIS — F900 Attention-deficit hyperactivity disorder, predominantly inattentive type: Secondary | ICD-10-CM

## 2017-08-08 DIAGNOSIS — K52831 Collagenous colitis: Secondary | ICD-10-CM | POA: Diagnosis not present

## 2017-08-08 DIAGNOSIS — Z23 Encounter for immunization: Secondary | ICD-10-CM

## 2017-08-08 MED ORDER — AMPHETAMINE-DEXTROAMPHETAMINE 20 MG PO TABS: ORAL_TABLET | ORAL | 0 refills | Status: DC

## 2017-08-08 MED ORDER — AMPHETAMINE-DEXTROAMPHET ER 30 MG PO CP24: 30.0000 mg | ORAL_CAPSULE | ORAL | 0 refills | Status: DC

## 2017-08-08 NOTE — Progress Notes (Signed)
   Subjective:    Patient ID: Latoya Olson, female    DOB: 02-18-1966, 51 y.o.   MRN: [id number]  HPI Here to get refills on Adderall and to discuss right flank pain. She was seen here 2 weeks ago for right midback and right flank pain. She described this as a constant pain that lasted several days. She was also constipated at the time. She had been passing small hard stools and then went 3 days with no stools prior to the pain starting. No fever or nausea. No urinary symptoms. She took some OTC laxatives and had several large BMs. The pain then immediately stopped. She does have collagenous colitis and she may have an element of IBS as well. otherwsie the Adderall still works well for her.    Review of Systems  Constitutional: Negative.   Respiratory: Negative.   Cardiovascular: Negative.   Gastrointestinal: Positive for abdominal pain and constipation. Negative for abdominal distention, anal bleeding, blood in stool, diarrhea, nausea, rectal pain and vomiting.  Genitourinary: Negative.   Neurological: Negative.        Objective:   Physical Exam  Constitutional: She is oriented to person, place, and time. She appears well-developed and well-nourished.  Cardiovascular: Normal rate, regular rhythm, normal heart sounds and intact distal pulses.   Pulmonary/Chest: Effort normal and breath sounds normal.  Abdominal: Soft. Bowel sounds are normal. She exhibits no distension and no mass. There is no tenderness. There is no rebound and no guarding.  Neurological: She is alert and oriented to person, place, and time.          Assessment & Plan:  The right flank pain is consistent with constipation and she probably has some IBS. I suggested she take Miralax every day. Follow up with Dr. Leone PayorGessner. Her ADHD is stable.  Gershon CraneStephen Joeli Fenner, MD

## 2017-08-09 ENCOUNTER — Encounter: Payer: Self-pay | Admitting: Internal Medicine

## 2017-08-09 ENCOUNTER — Other Ambulatory Visit: Payer: Self-pay | Admitting: Internal Medicine

## 2017-08-09 DIAGNOSIS — K52831 Collagenous colitis: Secondary | ICD-10-CM

## 2017-08-09 MED ORDER — BUDESONIDE 3 MG PO CPEP: 9.0000 mg | ORAL_CAPSULE | Freq: Every day | ORAL | 3 refills | Status: DC

## 2017-09-19 ENCOUNTER — Telehealth: Payer: Self-pay | Admitting: Obstetrics and Gynecology

## 2017-09-19 NOTE — Telephone Encounter (Signed)
Left message on voicemail to call and reschedule cancelled appointment. °

## 2017-09-20 ENCOUNTER — Ambulatory Visit: Payer: BLUE CROSS/BLUE SHIELD | Admitting: Obstetrics and Gynecology

## 2017-09-21 ENCOUNTER — Encounter: Payer: Self-pay | Admitting: Internal Medicine

## 2017-09-21 ENCOUNTER — Ambulatory Visit (INDEPENDENT_AMBULATORY_CARE_PROVIDER_SITE_OTHER): Payer: BLUE CROSS/BLUE SHIELD | Admitting: Internal Medicine

## 2017-09-21 VITALS — BP 110/70 | HR 84 | Ht 65.0 in | Wt 171.4 lb

## 2017-09-21 DIAGNOSIS — K5900 Constipation, unspecified: Secondary | ICD-10-CM | POA: Diagnosis not present

## 2017-09-21 DIAGNOSIS — K52831 Collagenous colitis: Secondary | ICD-10-CM

## 2017-09-21 DIAGNOSIS — M2550 Pain in unspecified joint: Secondary | ICD-10-CM

## 2017-09-21 NOTE — Progress Notes (Signed)
Latoya DanielsWinifred Olson Olson 51 y.o. [birth year] [id number]  Assessment & Plan:   Encounter Diagnoses  Name Primary?  . Collagenous colitis Yes  . Arthralgia, unspecified joint   . Acute constipation     Right now her collagenous colitis seems under control.  She is taking 3 mg of budesonide.  Once again reviewed the side effects of that.  We briefly discussed the use of something like Humira or other biologic's but I do not think the risk-benefit profile favor using that right now.  I do think she may be having arthralgia from mesalamine.  I am going to discontinue the mesalamine to see if that fixes the arthralgia.  She has been using some intermittent Advil or perhaps even daily.  Might need to switch over to Celebrex if that is Olson continued need as that is less likely to trigger problems with collagenous colitis.  I think she had acute constipation and right upper quadrant pain related to Flexeril use earlier in the year, that is resolved.  We will arrange follow-up after she messages me back through my chart regarding change in arthralgia after stopping mesalamine.  January will be 10-year anniversary from last colonoscopy and time for Olson routine preventive screening exam approximately.  We will notify her through the typical protocol.  I appreciate the opportunity to care for this patient. CC: Latoya Olson, Latoya A, MD    Subjective:   Chief Complaint: Follow-up of collagenous colitis  HPI Latoya FailWendy is here for follow-up, unaccompanied.  It has been hectic with the kids being out of school and she mistakenly came yesterday for her appointment and then was Olson bit late today but we were able to see her.  For the most part her diarrhea is under control she says.  I think she is satisfied with her quality of life regarding that, taking 3 mg of budesonide daily.  She has been having Olson lot of arthralgia.  She also had Olson lot of bruising which we could not determine why and wondered if it might be related to  the budesonide and I had her stop that in the summer and start the Lialda.  Subsequently she reduced her Lialda from 4.8 g total dose to 2.4 g and she got off birth control pills.  She did have to go back on budesonide because of some diarrhea as best I can tell.  Bruising is much less.  She has terrible arthralgias.  Taking Advil often as once Olson day.  This seemed to start in the summer.  She also had an episode of back pain and was prescribed Flexeril.  She took Olson trip and went to Olson conference and was taking the Flexeril and she was having bad right upper quadrant and flank pain and was constipated on the Flexeril.  That was severe and made her wonder if she had Olson tumor or something like that but it all resolved after stopping the Flexeril. Allergies  Allergen Reactions  . Vicodin [Hydrocodone-Acetaminophen] Nausea Only   Current Meds  Medication Sig  . ACZONE 5 % topical gel Apply 1 application topically as needed.  Marland Kitchen. amphetamine-dextroamphetamine (ADDERALL XR) 30 MG 24 hr capsule Take 1 capsule (30 mg total) by mouth every morning.  Marland Kitchen. amphetamine-dextroamphetamine (ADDERALL) 20 MG tablet Daily in the evenings  . budesonide (ENTOCORT EC) 3 MG 24 hr capsule Take 3 capsules (9 mg total) by mouth daily. And as directed  . ibuprofen (ADVIL,MOTRIN) 200 MG tablet Take 200 mg by mouth every  6 (six) hours as needed for pain.  Marland Kitchen. sertraline (ZOLOFT) 100 MG tablet TAKE 1 TABLET BY MOUTH EVERY DAY   Past Medical History:  Diagnosis Date  . ADHD (attention deficit hyperactivity disorder)   . Anxiety   . Collagenous colitis 2009   sees Dr. Stan Headarl Gessner   . Dysmenorrhea   . Fall (on) (from) unspecified stairs and steps, initial encounter 07/2016   hurt left calf--badly bruised  . Hemorrhoids   . Premenstrual dysphoric disorder    Past Surgical History:  Procedure Laterality Date  . COLONOSCOPY  10/23/2007   per Dr. Leone PayorGessner, collagenous colitis, no polyps, repeat 10 yrs    Social History   Social  History Narrative   Divorced   Has her own photography business   2 daughters   2 glasses of wine daily   No tobacco no drugs   09/21/2017   family history includes Cancer (age of onset: 6372) in her father; Colon cancer in her mother; Congestive Heart Failure (age of onset: 2765) in her mother; Coronary artery disease in her unknown relative; Hypertension in her father; Lung cancer in her unknown relative; Lupus in her mother; Lymphoma in her mother.   Review of Systems She is lightheaded when she rises quickly at times.  Objective:   Physical Exam BP 110/70   Pulse 84   Ht 5\' 5"  (1.651 m)   Wt 171 lb 6.4 oz (77.7 kg)   BMI 28.52 kg/m  No acute distress  15 minutes time spent with patient > half in counseling coordination of care

## 2017-09-21 NOTE — Patient Instructions (Signed)
  Stop your Lialda per Dr Leone PayorGessner.   Message us in 2 weeks with an update on how your doing.  Normal BMI (Body Mass Index- based on height and weight) is between 19 and 25. Your BMI today is Body mass index is 28.52 kg/m. Marland Kitchen. Please consider follow up  regarding your BMI with your Primary Care Provider.   Follow up with us as needed.   I appreciate the opportunity to care for you. Stan Headarl Gessner, MD, [facility]

## 2017-11-07 ENCOUNTER — Telehealth: Payer: Self-pay | Admitting: Family Medicine

## 2017-11-07 ENCOUNTER — Ambulatory Visit: Payer: Self-pay | Admitting: Family Medicine

## 2017-11-07 DIAGNOSIS — Z0289 Encounter for other administrative examinations: Secondary | ICD-10-CM

## 2017-11-07 MED ORDER — AMPHETAMINE-DEXTROAMPHET ER 30 MG PO CP24
30.0000 mg | ORAL_CAPSULE | ORAL | 0 refills | Status: DC
Start: 2017-11-07 — End: 2017-11-07

## 2017-11-07 MED ORDER — AMPHETAMINE-DEXTROAMPHETAMINE 20 MG PO TABS: ORAL_TABLET | ORAL | 0 refills | Status: DC

## 2017-11-07 MED ORDER — AMPHETAMINE-DEXTROAMPHET ER 30 MG PO CP24: 30.0000 mg | ORAL_CAPSULE | ORAL | 0 refills | Status: DC

## 2017-11-07 NOTE — Telephone Encounter (Signed)
Done

## 2017-11-07 NOTE — Telephone Encounter (Signed)
Copied from CRM [phone]. Topic: Quick Communication - See Telephone Encounter >> Nov 07, 2017  9:22 AM Terisa Starraylor, Brittany L wrote: CRM for notification. See Telephone encounter for:   11/07/17.  amphetamine-dextroamphetamine (ADDERALL XR) 30 MG 24 hr capsule    amphetamine-dextroamphetamine (ADDERALL) 20 MG tablet   [facility] [id number] - [place], [place] - [address] [address]

## 2017-11-07 NOTE — Telephone Encounter (Signed)
Last OV 08/08/2017  Both Rx's were last refilled 08/08/2017 disp 30 with no refills   Sent to PCP for approval.

## 2017-11-08 NOTE — Telephone Encounter (Signed)
Called and spoke with pt. Pt advised and voiced understanding.  

## 2017-11-27 ENCOUNTER — Other Ambulatory Visit: Payer: Self-pay | Admitting: Obstetrics and Gynecology

## 2017-11-27 DIAGNOSIS — Z1231 Encounter for screening mammogram for malignant neoplasm of breast: Secondary | ICD-10-CM

## 2017-12-01 ENCOUNTER — Telehealth: Payer: Self-pay | Admitting: Family Medicine

## 2017-12-01 NOTE — Telephone Encounter (Signed)
This encounter was created in error - please disregard.

## 2017-12-01 NOTE — Telephone Encounter (Signed)
Patient called and said she will be out of town when the next prescription for Adderall will be due. [place] to know if she could have it called into a pharmacy there. She did not know if out of state would make a difference.  Please call and let her know what can be done.

## 2017-12-01 NOTE — Telephone Encounter (Signed)
Pt called and said she will be out of town when next prescription for Adderall will be due.  [place] and wants to know if she could have it called into pharmacy there. She did not know if out of state would make a difference.

## 2017-12-04 NOTE — Telephone Encounter (Signed)
Last ov   Last refilled

## 2017-12-04 NOTE — Telephone Encounter (Signed)
Last OV 08/08/2017   Last refilled both adderall dose on 11/07/2017 with refills on both   Called pt and advised her that she should have refills and should check in with her pharmacy if she has any issues to give us a call back

## 2017-12-19 ENCOUNTER — Ambulatory Visit
Admission: RE | Admit: 2017-12-19 | Discharge: 2017-12-19 | Disposition: A | Discharge status: Home / Self Care | Payer: BLUE CROSS/BLUE SHIELD | Source: Ambulatory Visit | Attending: Obstetrics and Gynecology | Admitting: Obstetrics and Gynecology

## 2017-12-19 DIAGNOSIS — Z1231 Encounter for screening mammogram for malignant neoplasm of breast: Secondary | ICD-10-CM

## 2017-12-20 ENCOUNTER — Other Ambulatory Visit: Payer: Self-pay

## 2017-12-20 ENCOUNTER — Ambulatory Visit (INDEPENDENT_AMBULATORY_CARE_PROVIDER_SITE_OTHER): Payer: BLUE CROSS/BLUE SHIELD | Admitting: Obstetrics and Gynecology

## 2017-12-20 ENCOUNTER — Encounter: Payer: Self-pay | Admitting: Obstetrics and Gynecology

## 2017-12-20 ENCOUNTER — Other Ambulatory Visit (HOSPITAL_COMMUNITY)
Admission: RE | Admit: 2017-12-20 | Discharge: 2017-12-20 | Disposition: A | Discharge status: Home / Self Care | Payer: BLUE CROSS/BLUE SHIELD | Source: Ambulatory Visit | Attending: Obstetrics and Gynecology | Admitting: Obstetrics and Gynecology

## 2017-12-20 ENCOUNTER — Telehealth: Payer: Self-pay | Admitting: *Deleted

## 2017-12-20 VITALS — BP 142/88 | HR 66 | Resp 12 | Ht 64.5 in | Wt 170.0 lb

## 2017-12-20 DIAGNOSIS — R030 Elevated blood-pressure reading, without diagnosis of hypertension: Secondary | ICD-10-CM

## 2017-12-20 DIAGNOSIS — N951 Menopausal and female climacteric states: Secondary | ICD-10-CM

## 2017-12-20 DIAGNOSIS — Z01419 Encounter for gynecological examination (general) (routine) without abnormal findings: Secondary | ICD-10-CM

## 2017-12-20 DIAGNOSIS — Z124 Encounter for screening for malignant neoplasm of cervix: Secondary | ICD-10-CM | POA: Diagnosis not present

## 2017-12-20 DIAGNOSIS — Z0000 Encounter for general adult medical examination without abnormal findings: Secondary | ICD-10-CM | POA: Diagnosis not present

## 2017-12-20 DIAGNOSIS — E785 Hyperlipidemia, unspecified: Secondary | ICD-10-CM

## 2017-12-20 DIAGNOSIS — N912 Amenorrhea, unspecified: Secondary | ICD-10-CM | POA: Diagnosis not present

## 2017-12-20 MED ORDER — MEDROXYPROGESTERONE ACETATE 5 MG PO TABS: 5.0000 mg | ORAL_TABLET | Freq: Every day | ORAL | 0 refills | Status: DC

## 2017-12-20 NOTE — Addendum Note (Signed)
Addended by: Tobi BastosJERTSON, Josha Weekley E on: 12/20/2017 10:15 AM   Modules accepted: Orders

## 2017-12-20 NOTE — Patient Instructions (Signed)
EXERCISE AND DIET:  We recommended that you start or continue a regular exercise program for good health. Regular exercise means any activity that makes your heart beat faster and makes you sweat.  We recommend exercising at least 30 minutes per day at least 3 days a week, preferably 4 or 5.  We also recommend a diet low in fat and sugar.  Inactivity, poor dietary choices and obesity can cause diabetes, heart attack, stroke, and kidney damage, among others.    ALCOHOL AND SMOKING:  Women should limit their alcohol intake to no more than 7 drinks/beers/glasses of wine (combined, not each!) per week. Moderation of alcohol intake to this level decreases your risk of breast cancer and liver damage. And of course, no recreational drugs are part of a healthy lifestyle.  And absolutely no smoking or even second hand smoke. Most people know smoking can cause heart and lung diseases, but did you know it also contributes to weakening of your bones? Aging of your skin?  Yellowing of your teeth and nails?  CALCIUM AND VITAMIN D:  Adequate intake of calcium and Vitamin D are recommended.  The recommendations for exact amounts of these supplements seem to change often, but generally speaking 600 mg of calcium (either carbonate or citrate) and 800 units of Vitamin D per day seems prudent. Certain women may benefit from higher intake of Vitamin D.  If you are among these women, your doctor will have told you during your visit.    PAP SMEARS:  Pap smears, to check for cervical cancer or precancers,  have traditionally been done yearly, although recent scientific advances have shown that most women can have pap smears less often.  However, every woman still should have a physical exam from her gynecologist every year. It will include a breast check, inspection of the vulva and vagina to check for abnormal growths or skin changes, a visual exam of the cervix, and then an exam to evaluate the size and shape of the uterus and  ovaries.  And after 53 years of age, a rectal exam is indicated to check for rectal cancers. We will also provide age appropriate advice regarding health maintenance, like when you should have certain vaccines, screening for sexually transmitted diseases, bone density testing, colonoscopy, mammograms, etc.   MAMMOGRAMS:  All women over 40 years old should have a yearly mammogram. Many facilities now offer a "3D" mammogram, which may cost around $50 extra out of pocket. If possible,  we recommend you accept the option to have the 3D mammogram performed.  It both reduces the number of women who will be called back for extra views which then turn out to be normal, and it is better than the routine mammogram at detecting truly abnormal areas.    COLONOSCOPY:  Colonoscopy to screen for colon cancer is recommended for all women at age 50.  We know, you hate the idea of the prep.  We agree, BUT, having colon cancer and not knowing it is worse!!  Colon cancer so often starts as a polyp that can be seen and removed at colonscopy, which can quite literally save your life!  And if your first colonoscopy is normal and you have no family history of colon cancer, most women don't have to have it again for 10 years.  Once every ten years, you can do something that may end up saving your life, right?  We will be happy to help you get it scheduled when you are ready.    Be sure to check your insurance coverage so you understand how much it will cost.  It may be covered as a preventative service at no cost, but you should check your particular policy.      Menopause and Hormone Replacement Therapy What is hormone replacement therapy? Hormone replacement therapy (HRT) is the use of artificial (synthetic) hormones to replace hormones that your body stops producing during menopause. Menopause is the normal time of life when menstrual periods stop completely and the ovaries stop producing the female hormones estrogen and  progesterone. This lack of hormones can affect your health and cause undesirable symptoms. HRT can relieve some of those symptoms. What are my options for HRT? HRT may consist of the synthetic hormones estrogen and progestin, or it may consist of only estrogen (estrogen-only therapy). You and your health care provider will decide which form of HRT is best for you. If you choose to be on HRT and you have a uterus, estrogen and progestin are usually prescribed. Estrogen-only therapy is used for women who do not have a uterus. Possible options for taking HRT include:  Pills.  Patches.  Gels.  Sprays.  Vaginal cream.  Vaginal rings.  Vaginal inserts.  The amount of hormone(s) that you take and how long you take the hormone(s) varies depending on your individual health. It is important to:  Begin HRT with the lowest possible dosage.  Stop HRT as soon as your health care provider tells you to stop.  Work with your health care provider so that you feel informed and comfortable with your decisions.  What are the benefits of HRT? HRT can reduce the frequency and severity of menopausal symptoms. Benefits of HRT vary depending on the menopausal symptoms that you have, the severity of your symptoms, and your overall health. HRT may help to improve the following menopausal symptoms:  Hot flashes and night sweats. These are sudden feelings of heat that spread over the face and body. The skin may turn red, like a blush. Night sweats are hot flashes that happen while you are sleeping or trying to sleep.  Bone loss (osteoporosis). The body loses calcium more quickly after menopause, causing the bones to become weaker. This can increase the risk for bone breaks (fractures).  Vaginal dryness. The lining of the vagina can become thin and dry, which can cause pain during sexual intercourse or cause infection, burning, or itching.  Urinary tract infections.  Urinary incontinence. This is a  decreased ability to control when you urinate.  Irritability.  Short-term memory problems.  What are the risks of HRT? Risks of HRT vary depending on your individual health and medical history. Risks of HRT also depend on whether you receive both estrogen and progestin or you receive estrogen only.HRT may increase the risk of:  Spotting. This is when a small amount of bloodleaks from the vagina unexpectedly.  Endometrial cancer. This cancer is in the lining of the uterus (endometrium).  Breast cancer.  Increased density of breast tissue. This can make it harder to find breast cancer on a breast X-ray (mammogram).  Stroke.  Heart attack.  Blood clots.  Gallbladder disease.  Risks of HRT can increase if you have any of the following conditions:  Endometrial cancer.  Liver disease.  Heart disease.  Breast cancer.  History of blood clots.  History of stroke.  How should I care for myself while I am on HRT?  Take over-the-counter and prescription medicines only as told by your health care   provider.  Get mammograms, pelvic exams, and medical checkups as often as told by your health care provider.  Have Pap tests done as often as told by your health care provider. A Pap test is sometimes called a Pap smear. It is a screening test that is used to check for signs of cancer of the cervix and vagina. A Pap test can also identify the presence of infection or precancerous changes. Pap tests may be done: ? Every 3 years, starting at age 21. ? Every 5 years, starting after age 30, in combination with testing for human papillomavirus (HPV). ? More often or less often depending on other medical conditions you have, your age, and other risk factors.  It is your responsibility to get your Pap test results. Ask your health care provider or the department performing the test when your results will be ready.  Keep all follow-up visits as told by your health care provider. This is  important. When should I seek medical care? Talk with your health care provider if:  You have any of these: ? Pain or swelling in your legs. ? Shortness of breath. ? Chest pain. ? Lumps or changes in your breasts or armpits. ? Slurred speech. ? Pain, burning, or bleeding when you urine.  You develop any of these: ? Unusual vaginal bleeding. ? Dizziness or headaches. ? Weakness or numbness in any part of your arms or legs. ? Pain in your abdomen.  This information is not intended to replace advice given to you by your health care provider. Make sure you discuss any questions you have with your health care provider. Document Released: 06/25/2003 Document Revised: 08/23/2016 Document Reviewed: 03/30/2015 Elsevier Interactive Patient Education  2017 Elsevier Inc.  

## 2017-12-20 NOTE — Telephone Encounter (Signed)
Detailed message left on mobile number per DPR advising patient to follow up with PCP due to elevated BP at office visit today. Advised patient to return call if any additional questions.   Will close encounter.

## 2017-12-20 NOTE — Progress Notes (Addendum)
52 y.o. [id number] DivorcedCaucasianF here for annual exam.  She stopped Loloestrin 3 months ago. She had one light cycle right after stopping, nothing since. She is having about 10 hot flashes a day. She is having night sweats about 2 x a night. She is using CBD tablets, helping her sleep. As long as she is taking the CBD pills she is doing okay. Mood changes, she feels she is depressed, reacting stronger than she would typically to situations. Angry.  She is on Zoloft, not taking it daily, may take it one x a week.  No vaginal dryness. Not sexually active.      No LMP recorded (within months). Patient is not currently having periods (Reason: Other).          Sexually active: No.  The current method of family planning is abstinence.    Exercising: No.  The patient does not participate in regular exercise at present. Smoker:  no  Health Maintenance: Pap:  11/17/14 negative, HR HPV negative, 10/31/13 negative  History of abnormal Pap:  no MMG:  12/19/17 BIRADS 1 negative  Colonoscopy:  1/09 Dr. Leone PayorGessner- hx colitis  BMD:   2015 normal with Dr. Leone PayorGessner  TDaP:  PCP Gardasil: n/a   reports that  has never smoked. she has never used smokeless tobacco. She reports that she drinks about 16.8 oz of alcohol per week. She reports that she does not use drugs. She and her daughters are moving to [place] this summer. Her oldest is starting college in the fall (in [place]), younger daughter is [id number]. Her family is in [place]. She is self employed, Environmental managerphotographer. Single parent. Daughters have been difficult, not supportive of her dating.  She is having 2 glasses of wine a day on average.   Past Medical History:  Diagnosis Date  . ADHD (attention deficit hyperactivity disorder)   . Anxiety   . Collagenous colitis 2009   sees Dr. Stan Headarl Gessner   . Dysmenorrhea   . Fall (on) (from) unspecified stairs and steps, initial encounter 07/2016   hurt left calf--badly bruised  . Hemorrhoids   . Premenstrual dysphoric  disorder     Past Surgical History:  Procedure Laterality Date  . COLONOSCOPY  10/23/2007   per Dr. Leone PayorGessner, collagenous colitis, no polyps, repeat 10 yrs     Current Outpatient Medications  Medication Sig Dispense Refill  . amphetamine-dextroamphetamine (ADDERALL XR) 30 MG 24 hr capsule Take 1 capsule (30 mg total) by mouth every morning. 30 capsule 0  . amphetamine-dextroamphetamine (ADDERALL) 20 MG tablet Daily in the evenings 30 tablet 0  . budesonide (ENTOCORT EC) 3 MG 24 hr capsule Take 3 capsules (9 mg total) by mouth daily. And as directed 90 capsule 3  . ibuprofen (ADVIL,MOTRIN) 200 MG tablet Take 200 mg by mouth every 6 (six) hours as needed for pain.    Marland Kitchen. sertraline (ZOLOFT) 100 MG tablet TAKE 1 TABLET BY MOUTH EVERY DAY 90 tablet 1   No current facility-administered medications for this visit.     Family History  Problem Relation Age of Onset  . Lymphoma Mother   . Colon cancer Mother        --anal CA--  . Lupus Mother   . Congestive Heart Failure Mother [id number]       DEC from CHF  . Cancer Father [id number]       DEC from squamous cell CA  . Hypertension Father   . Coronary artery disease Unknown   . Lung cancer Unknown  Review of Systems  Constitutional: Negative.   HENT: Negative.   Respiratory: Negative.   Cardiovascular: Negative.   Gastrointestinal: Negative.   Endocrine: Positive for heat intolerance.  Genitourinary: Negative.   Musculoskeletal: Negative.   Skin: Negative.   Allergic/Immunologic: Negative.   Neurological: Negative.   Hematological: Negative.   Psychiatric/Behavioral: Negative.     Exam:   BP 140/88 (BP Location: Right Arm, Patient Position: Sitting, Cuff Size: Normal)   Pulse 66   Resp 12   Ht 5' 4.5" (1.638 m)   Wt 170 lb (77.1 kg)   LMP  (Within Months) Comment: ~3 months ago per patient  BMI 28.73 kg/m   Weight change: @WEIGHTCHANGE @ Height:   Height: 5' 4.5" (163.8 cm)  Ht Readings from Last 3 Encounters:  12/20/17 5' 4.5"  (1.638 m)  09/21/17 5\' 5"  (1.651 m)  07/25/17 5\' 5"  (1.651 m)    General appearance: alert, cooperative and appears stated age Head: Normocephalic, without obvious abnormality, atraumatic Neck: no adenopathy, supple, symmetrical, trachea midline and thyroid normal to inspection and palpation Lungs: clear to auscultation bilaterally Cardiovascular: regular rate and rhythm Breasts: normal appearance, no masses or tenderness Abdomen: soft, non-tender; non distended,  no masses,  no organomegaly Extremities: extremities normal, atraumatic, no cyanosis or edema Skin: Skin color, texture, turgor normal. No rashes or lesions Lymph nodes: Cervical, supraclavicular, and axillary nodes normal. No abnormal inguinal nodes palpated Neurologic: Grossly normal   Pelvic: External genitalia:  no lesions              Urethra:  normal appearing urethra with no masses, tenderness or lesions              Bartholins and Skenes: normal                 Vagina: normal appearing vagina with normal color and discharge, no lesions              Cervix: no lesions               Bimanual Exam:  Uterus:  normal size, contour, position, consistency, mobility, non-tender              Adnexa: no mass, fullness, tenderness               Rectovaginal: Confirms               Anus:  normal sphincter tone, no lesions  Chaperone was present for exam.  A:  Well Woman with normal exam  Stopped OCP's 3 months ago, no cycle  Vasomotor symptoms  Sleep disturbance  Depression  P:   Pap with hpv  She will restart her Zoloft, start at 50 mg a day, increase to 100 mg after a week, can go up from there   F/U in 3 weeks  She can try estroven or estroven pm  If her symptoms are still bothersome in 3 weeks, can go up more or her Zoloft, can consider gabapentin or HRT   Information on HRT given, risks reviewed  Provera w/d now  [facility]  Mammogram UTD  Colonoscopy Due  Discussed breast self exam  Discussed calcium and vit D  intake  Names of counselors given    Addendum: BP elevated, patient denies h/o HTN. Repeat BP still elevated will recommend f/u with her primary MD

## 2017-12-21 ENCOUNTER — Ambulatory Visit: Payer: BLUE CROSS/BLUE SHIELD | Admitting: Obstetrics and Gynecology

## 2017-12-21 LAB — COMPREHENSIVE METABOLIC PANEL
ALT: 35 IU/L — AB (ref 0–32)
AST: 32 IU/L (ref 0–40)
Albumin/Globulin Ratio: 2 (ref 1.2–2.2)
Albumin: 4.5 g/dL (ref 3.5–5.5)
Alkaline Phosphatase: 53 IU/L (ref 39–117)
BUN/Creatinine Ratio: 21 (ref 9–23)
BUN: 15 mg/dL (ref 6–24)
Bilirubin Total: 0.4 mg/dL (ref 0.0–1.2)
CALCIUM: 9.2 mg/dL (ref 8.7–10.2)
CO2: 24 mmol/L (ref 20–29)
CREATININE: 0.71 mg/dL (ref 0.57–1.00)
Chloride: 102 mmol/L (ref 96–106)
GFR calc Af Amer: 112 mL/min/{1.73_m2} (ref >59)
GFR, EST NON AFRICAN AMERICAN: 98 mL/min/{1.73_m2} (ref >59)
Globulin, Total: 2.3 g/dL (ref 1.5–4.5)
Glucose: 86 mg/dL (ref 65–99)
Potassium: 3.9 mmol/L (ref 3.5–5.2)
Sodium: 142 mmol/L (ref 134–144)
Total Protein: 6.8 g/dL (ref 6.0–8.5)

## 2017-12-21 LAB — LIPID PANEL
CHOL/HDL RATIO: 2.6 ratio (ref 0.0–4.4)
Cholesterol, Total: 207 mg/dL — ABNORMAL HIGH (ref 100–199)
HDL: 79 mg/dL (ref >39)
LDL CALC: 112 mg/dL — AB (ref 0–99)
TRIGLYCERIDES: 78 mg/dL (ref 0–149)
VLDL CHOLESTEROL CAL: 16 mg/dL (ref 5–40)

## 2017-12-21 LAB — CYTOLOGY - PAP
Diagnosis: NEGATIVE
HPV: NOT DETECTED

## 2017-12-21 LAB — FOLLICLE STIMULATING HORMONE: FSH: 70.2 m[IU]/mL

## 2017-12-25 ENCOUNTER — Telehealth: Payer: Self-pay | Admitting: *Deleted

## 2017-12-25 NOTE — Telephone Encounter (Signed)
Spoke with patient and gave results and recommendations. Patient states she drinks 2 glasses of wine a night. Advised to stop for 2 months or cut back as much as possible and have LFT rechecked. Patient scheduled for 2 month recheck -eh

## 2017-12-25 NOTE — Telephone Encounter (Signed)
Left message to call regarding results -eh 

## 2017-12-25 NOTE — Telephone Encounter (Signed)
-----   Message from Romualdo BolkJill Evelyn Jertson, MD sent at 12/25/2017 10:08 AM EDT ----- Please inform the patient that one of her LFT's is slightly elevated. I would recommend that she stop ETOH, avoid tylenol and have her LFT's repeated in 2 months (please order).  Her FSH is in the menopausal range. She should let me know if she has any bleeding after stopping the provera (given a script). Her total and LDL cholesterol are minimally elevated (LDL is down from last year), but her overall lipid panel is very good. I don't think she needs to make any current changes for that.  02 recall.

## 2018-01-08 ENCOUNTER — Telehealth: Payer: Self-pay | Admitting: Obstetrics and Gynecology

## 2018-01-08 NOTE — Telephone Encounter (Signed)
Left message to call Kaitlyn at [phone]. 

## 2018-01-08 NOTE — Telephone Encounter (Signed)
Patient cancelled 3 week fu. Says she is doing fine and not sure if she need to come in.

## 2018-01-09 NOTE — Telephone Encounter (Signed)
Patient was scheduled for a 3 week recheck for Zoloft. Spoke with patient who states that she is not taking Zoloft. Advised if not taking does not need to return for recheck. Patient verbalizes understanding.  Routing to provider for final review. Patient agreeable to disposition. Will close encounter.

## 2018-01-09 NOTE — Telephone Encounter (Signed)
Patient returning Kaitlyn's call. °

## 2018-01-10 ENCOUNTER — Ambulatory Visit: Payer: BLUE CROSS/BLUE SHIELD | Admitting: Obstetrics and Gynecology

## 2018-02-12 ENCOUNTER — Telehealth: Payer: Self-pay | Admitting: Family Medicine

## 2018-02-12 NOTE — Telephone Encounter (Signed)
Copied from CRM [id number]. Topic: Quick Communication - Rx Refill/Question >> Feb 12, 2018  4:15 PM Laural Benes, [place] C wrote: Medication: amphetamine-dextroamphetamine (ADDERALL) 20 MG tablet and also amphetamine-dextroamphetamine (ADDERALL XR) 30 MG 24 hr capsule --- 3 month supply if possible.  Has the patient contacted their pharmacy? No  (Agent: If no, request that the patient contact the pharmacy for the refill.)  Preferred Pharmacy (with phone number or street name): [id number] - [place], [place] - [address] AT [facility] [address]  Agent: Please be advised that RX refills may take up to 3 business days. We ask that you follow-up with your pharmacy.

## 2018-02-13 NOTE — Telephone Encounter (Signed)
Sent to PCP to advise 

## 2018-02-13 NOTE — Telephone Encounter (Signed)
Refill request for  Adderall 20 mg  LR 01/08/18 for #30 tabs                              Adderall XR 30 mg 25 hr cap for #30 capsules  Provider  Dr. Clent Ridges LOV  08/08/17 [facility]  None  Pharmacy  [facility] [address]

## 2018-02-15 MED ORDER — AMPHETAMINE-DEXTROAMPHETAMINE 20 MG PO TABS: 20.0000 mg | ORAL_TABLET | Freq: Every day | ORAL | 0 refills | Status: DC

## 2018-02-15 MED ORDER — AMPHETAMINE-DEXTROAMPHET ER 30 MG PO CP24: 30.0000 mg | ORAL_CAPSULE | ORAL | 0 refills | Status: DC

## 2018-02-15 MED ORDER — AMPHETAMINE-DEXTROAMPHETAMINE 20 MG PO TABS
20.0000 mg | ORAL_TABLET | Freq: Every day | ORAL | 0 refills | Status: DC
Start: 2018-04-17 — End: 2018-03-30

## 2018-02-15 NOTE — Telephone Encounter (Signed)
Called pt and left a detailed VM that Rx's have been sent into pt's preferred pharmacy.

## 2018-02-15 NOTE — Telephone Encounter (Signed)
Done

## 2018-02-23 ENCOUNTER — Telehealth: Payer: Self-pay | Admitting: Obstetrics and Gynecology

## 2018-02-23 ENCOUNTER — Other Ambulatory Visit: Payer: Self-pay

## 2018-02-23 NOTE — Telephone Encounter (Signed)
Patient called and left a message after hours cancelling her lab appointment to "recheck LFT." I returned her call and left a message to call back and reschedule. Routing to provider for FYI.

## 2018-03-08 NOTE — Telephone Encounter (Signed)
Called and left patient a message to call back and reschedule this cancelled appointment. °

## 2018-03-15 NOTE — Telephone Encounter (Signed)
Please send the patient a letter advising her to f/u here or with her primary to have her LFT's rechecked, then please close the encounter.   CC: Starla Curl

## 2018-03-15 NOTE — Telephone Encounter (Signed)
Patient has not returned calls to reschedule this cancelled appointment to recheck LFT. Okay to close encounter or is further follow up needed?

## 2018-03-19 ENCOUNTER — Telehealth: Payer: Self-pay | Admitting: Family Medicine

## 2018-03-19 NOTE — Telephone Encounter (Signed)
Copied from CRM [id number]. Topic: Quick Communication - See Telephone Encounter >> Mar 19, 2018 10:17 AM Arlyss Gandyichardson, Taichi Repka N, NT wrote: CRM for notification. See Telephone encounter for: 03/19/18. Pt called to get her amphetamine-dextroamphetamine (ADDERALL XR) 30 MG 24 hr capsule and the 20 mg refilled at the pharmacy and both rx's that were sent in state not able to refill until 04/17/18. She did not get a refill for June. Last refill was 02/15/18. Can this be fixed? Please advise.

## 2018-03-19 NOTE — Telephone Encounter (Signed)
Called [facility] to verify. The pharmacist stated that pt does have refills for both adderall Rx's for this month.   Called pt and left a detailed VM advising the pt that she does have refills and they plan to get these prescription ready for pick up today for the pt. Advised pt to call back if they have any further questions

## 2018-03-19 NOTE — Telephone Encounter (Signed)
Forwarding to Kennon RoundsSally, Education officer, museumN nurse manager for letter

## 2018-03-30 ENCOUNTER — Encounter: Payer: Self-pay | Admitting: Family Medicine

## 2018-03-30 ENCOUNTER — Ambulatory Visit: Payer: BLUE CROSS/BLUE SHIELD | Admitting: Family Medicine

## 2018-03-30 VITALS — BP 120/84 | HR 71 | Temp 98.5°F | Ht 64.5 in | Wt 167.8 lb

## 2018-03-30 DIAGNOSIS — F411 Generalized anxiety disorder: Secondary | ICD-10-CM

## 2018-03-30 DIAGNOSIS — F900 Attention-deficit hyperactivity disorder, predominantly inattentive type: Secondary | ICD-10-CM

## 2018-03-30 DIAGNOSIS — K52831 Collagenous colitis: Secondary | ICD-10-CM | POA: Diagnosis not present

## 2018-03-30 MED ORDER — AMPHETAMINE-DEXTROAMPHETAMINE 20 MG PO TABS: 20.0000 mg | ORAL_TABLET | Freq: Every day | ORAL | 0 refills | Status: DC

## 2018-03-30 MED ORDER — AMPHETAMINE-DEXTROAMPHET ER 30 MG PO CP24: 30.0000 mg | ORAL_CAPSULE | ORAL | 0 refills | Status: DC

## 2018-03-30 MED ORDER — SERTRALINE HCL 100 MG PO TABS: 100.0000 mg | ORAL_TABLET | Freq: Every day | ORAL | 0 refills | Status: AC

## 2018-03-30 MED ORDER — BUDESONIDE 3 MG PO CPEP: 3.0000 mg | ORAL_CAPSULE | Freq: Every day | ORAL | 0 refills | Status: AC

## 2018-03-30 NOTE — Progress Notes (Signed)
   Subjective:    Patient ID: Latoya Olson, female    DOB: 03/25/1966, 52 y.o.   MRN: [id number]  HPI Here for refills on medications because she is moving to [place] next week. She has family there and her oldest daughter will be attending the [employer] [place]. She is doing well. Her anxiety and ADHD are stable. She had been seeing Dr. Leone PayorGessner for collagenous colitis, and this has been well controlled. She has been able to decrease her daily dose of Budesonide from 9 mg to 3 mg.    Review of Systems  Constitutional: Negative.   Respiratory: Negative.   Cardiovascular: Negative.   Gastrointestinal: Negative.   Neurological: Negative.   Psychiatric/Behavioral: Negative.        Objective:   Physical Exam  Constitutional: She is oriented to person, place, and time. She appears well-developed and well-nourished.  Cardiovascular: Normal rate, regular rhythm, normal heart sounds and intact distal pulses.  Pulmonary/Chest: Effort normal and breath sounds normal.  Abdominal: Soft. Bowel sounds are normal. She exhibits no distension and no mass. There is no tenderness. There is no rebound and no guarding. No hernia.  Neurological: She is alert and oriented to person, place, and time.  Psychiatric: She has a normal mood and affect. Her behavior is normal. Thought content normal.          Assessment & Plan:  She is doing well. Her ADHD and anxiety and colitis are stable. All meds were refilled for 90 days. She will establish with a new PCP and a new GI after she settles in. Gershon CraneStephen Fry, MD

## 2018-04-03 NOTE — Telephone Encounter (Signed)
Call to patient. Advised calling to follow-up on repeat LFT's. Patient states she has been for another appointment with PCP and reviewed labs with him.  Per patient, PCP was not concerned about these levels and did not think she needed repeat labs.  Patient is comfortable with his recommendation and does not want to repeat.   Routing to Dr Oscar LaJertson.

## 2018-07-04 ENCOUNTER — Telehealth: Payer: Self-pay | Admitting: Family Medicine

## 2018-07-04 NOTE — Telephone Encounter (Signed)
LOV  03/30/18 Dr. Clent RidgesFry See pt. Request.

## 2018-07-04 NOTE — Telephone Encounter (Signed)
Copied from CRM [id number]. Topic: Quick Communication - Rx Refill/Question >> Jul 04, 2018  2:43 PM Laural BenesJohnson, [place] C wrote: Medication: amphetamine-dextroamphetamine (ADDERALL) 20 MG tablet  and also amphetamine-dextroamphetamine (ADDERALL XR) 30 MG 24 hr capsule  ---pt says that [place] but was told by provider at her last ov that he would take care of Rx.   Has the patient contacted their pharmacy? No   (Agent: If no, request that the patient contact the pharmacy for the refill.) (Agent: If yes, when and what did the pharmacy advise?)  Preferred Pharmacy (with phone number or street name): [facility] [place] - [address], [place] [address] [phone] (Phone) [phone] (Fax)    Agent: Please be advised that RX refills may take up to 3 business days. We ask that you follow-up with your pharmacy.

## 2018-07-06 MED ORDER — AMPHETAMINE-DEXTROAMPHET ER 30 MG PO CP24: 30.0000 mg | ORAL_CAPSULE | ORAL | 0 refills | Status: DC

## 2018-07-06 MED ORDER — AMPHETAMINE-DEXTROAMPHET ER 30 MG PO CP24: 30.0000 mg | ORAL_CAPSULE | ORAL | 0 refills | Status: AC

## 2018-07-06 MED ORDER — AMPHETAMINE-DEXTROAMPHETAMINE 20 MG PO TABS: 20.0000 mg | ORAL_TABLET | Freq: Every day | ORAL | 0 refills | Status: DC

## 2018-07-06 MED ORDER — AMPHETAMINE-DEXTROAMPHETAMINE 20 MG PO TABS: 20.0000 mg | ORAL_TABLET | Freq: Every day | ORAL | 0 refills | Status: AC

## 2018-07-06 NOTE — Telephone Encounter (Signed)
I sent in refills for 3 months, but she will need to find a new primary care provider there in [place] for further refills

## 2018-07-06 NOTE — Telephone Encounter (Signed)
I have called and lmom on secure VM for the pt to make her aware of refills sent to the pharmacy and that she will also need new PCP to fill these meds after the refills run out.

## 2018-07-06 NOTE — Telephone Encounter (Signed)
Dr. Clent Ridges please advise of refill of medications. Thanks

## 2018-09-19 ENCOUNTER — Encounter

## 2022-10-12 ENCOUNTER — Other Ambulatory Visit: Payer: Self-pay | Admitting: Specialist

## 2022-10-12 DIAGNOSIS — Z136 Encounter for screening for cardiovascular disorders: Secondary | ICD-10-CM
